# Patient Record
Sex: Female | Born: 1955 | Race: Black or African American | Hispanic: No | Marital: Married | Smoking: Former smoker
Health system: Southern US, Community
[De-identification: ages and names within clinical notes are randomized; demographics above are authoritative.]

## PROBLEM LIST (undated history)

## (undated) DIAGNOSIS — E785 Hyperlipidemia, unspecified: Secondary | ICD-10-CM

## (undated) DIAGNOSIS — I10 Essential (primary) hypertension: Secondary | ICD-10-CM

## (undated) DIAGNOSIS — D869 Sarcoidosis, unspecified: Secondary | ICD-10-CM

## (undated) DIAGNOSIS — N879 Dysplasia of cervix uteri, unspecified: Secondary | ICD-10-CM

## (undated) DIAGNOSIS — E669 Obesity, unspecified: Secondary | ICD-10-CM

## (undated) HISTORY — DX: Hyperlipidemia, unspecified: E78.5

## (undated) HISTORY — DX: Sarcoidosis, unspecified: D86.9

## (undated) HISTORY — PX: UMBILICAL HERNIA REPAIR: SHX196

## (undated) HISTORY — DX: Obesity, unspecified: E66.9

## (undated) HISTORY — DX: Dysplasia of cervix uteri, unspecified: N87.9

## (undated) HISTORY — DX: Essential (primary) hypertension: I10

## (undated) HISTORY — PX: APPENDECTOMY: SHX54

---

## 1998-12-24 ENCOUNTER — Other Ambulatory Visit: Admission: RE | Admit: 1998-12-24 | Discharge: 1998-12-24 | Payer: Self-pay | Admitting: Obstetrics & Gynecology

## 1999-01-27 ENCOUNTER — Other Ambulatory Visit: Admission: RE | Admit: 1999-01-27 | Discharge: 1999-01-27 | Payer: Self-pay | Admitting: Obstetrics & Gynecology

## 1999-01-27 ENCOUNTER — Encounter (INDEPENDENT_AMBULATORY_CARE_PROVIDER_SITE_OTHER): Payer: Self-pay | Admitting: Specialist

## 1999-03-10 ENCOUNTER — Other Ambulatory Visit: Admission: RE | Admit: 1999-03-10 | Discharge: 1999-03-10 | Payer: Self-pay | Admitting: Obstetrics & Gynecology

## 1999-05-04 ENCOUNTER — Other Ambulatory Visit: Admission: RE | Admit: 1999-05-04 | Discharge: 1999-05-04 | Payer: Self-pay | Admitting: Obstetrics & Gynecology

## 1999-05-04 ENCOUNTER — Encounter (INDEPENDENT_AMBULATORY_CARE_PROVIDER_SITE_OTHER): Payer: Self-pay | Admitting: Specialist

## 1999-05-04 HISTORY — PX: LEEP: SHX91

## 1999-08-24 ENCOUNTER — Other Ambulatory Visit: Admission: RE | Admit: 1999-08-24 | Discharge: 1999-08-24 | Payer: Self-pay | Admitting: Obstetrics & Gynecology

## 2000-03-02 ENCOUNTER — Other Ambulatory Visit: Admission: RE | Admit: 2000-03-02 | Discharge: 2000-03-02 | Payer: Self-pay | Admitting: Obstetrics & Gynecology

## 2003-11-26 ENCOUNTER — Other Ambulatory Visit: Admission: RE | Admit: 2003-11-26 | Discharge: 2003-11-26 | Payer: Self-pay | Admitting: Family Medicine

## 2004-11-30 ENCOUNTER — Other Ambulatory Visit: Admission: RE | Admit: 2004-11-30 | Discharge: 2004-11-30 | Payer: Self-pay | Admitting: Family Medicine

## 2004-12-15 ENCOUNTER — Encounter: Admission: RE | Admit: 2004-12-15 | Discharge: 2004-12-15 | Payer: Self-pay | Admitting: Family Medicine

## 2006-02-28 ENCOUNTER — Other Ambulatory Visit: Admission: RE | Admit: 2006-02-28 | Discharge: 2006-02-28 | Payer: Self-pay | Admitting: Family Medicine

## 2006-03-28 ENCOUNTER — Ambulatory Visit (HOSPITAL_COMMUNITY): Admission: RE | Admit: 2006-03-28 | Discharge: 2006-03-28 | Payer: Self-pay | Admitting: Surgery

## 2006-08-21 ENCOUNTER — Inpatient Hospital Stay (HOSPITAL_COMMUNITY): Admission: EM | Admit: 2006-08-21 | Discharge: 2006-08-22 | Payer: Self-pay | Admitting: Emergency Medicine

## 2006-08-21 ENCOUNTER — Encounter (INDEPENDENT_AMBULATORY_CARE_PROVIDER_SITE_OTHER): Payer: Self-pay | Admitting: Specialist

## 2007-07-04 ENCOUNTER — Other Ambulatory Visit: Admission: RE | Admit: 2007-07-04 | Discharge: 2007-07-04 | Payer: Self-pay | Admitting: Family Medicine

## 2007-08-01 ENCOUNTER — Encounter: Admission: RE | Admit: 2007-08-01 | Discharge: 2007-08-01 | Payer: Self-pay | Admitting: Family Medicine

## 2008-08-03 ENCOUNTER — Other Ambulatory Visit: Admission: RE | Admit: 2008-08-03 | Discharge: 2008-08-03 | Payer: Self-pay | Admitting: Family Medicine

## 2008-08-12 ENCOUNTER — Encounter: Admission: RE | Admit: 2008-08-12 | Discharge: 2008-08-12 | Payer: Self-pay | Admitting: Family Medicine

## 2008-08-17 ENCOUNTER — Other Ambulatory Visit: Admission: RE | Admit: 2008-08-17 | Discharge: 2008-08-17 | Payer: Self-pay | Admitting: Family Medicine

## 2008-08-21 ENCOUNTER — Encounter: Admission: RE | Admit: 2008-08-21 | Discharge: 2008-08-21 | Payer: Self-pay | Admitting: Family Medicine

## 2009-09-17 ENCOUNTER — Other Ambulatory Visit: Admission: RE | Admit: 2009-09-17 | Discharge: 2009-09-17 | Payer: Self-pay | Admitting: Family Medicine

## 2010-02-24 ENCOUNTER — Encounter: Admission: RE | Admit: 2010-02-24 | Discharge: 2010-02-24 | Payer: Self-pay | Admitting: Family Medicine

## 2010-03-04 ENCOUNTER — Encounter: Admission: RE | Admit: 2010-03-04 | Discharge: 2010-03-04 | Payer: Self-pay | Admitting: Family Medicine

## 2010-03-30 ENCOUNTER — Encounter: Admission: RE | Admit: 2010-03-30 | Discharge: 2010-03-30 | Payer: Self-pay | Admitting: Family Medicine

## 2010-04-06 DIAGNOSIS — E785 Hyperlipidemia, unspecified: Secondary | ICD-10-CM | POA: Insufficient documentation

## 2010-04-06 DIAGNOSIS — E669 Obesity, unspecified: Secondary | ICD-10-CM | POA: Insufficient documentation

## 2010-04-06 DIAGNOSIS — I10 Essential (primary) hypertension: Secondary | ICD-10-CM | POA: Insufficient documentation

## 2010-04-06 DIAGNOSIS — N879 Dysplasia of cervix uteri, unspecified: Secondary | ICD-10-CM | POA: Insufficient documentation

## 2010-04-07 ENCOUNTER — Ambulatory Visit: Payer: Self-pay | Admitting: Emergency Medicine

## 2010-04-07 DIAGNOSIS — R05 Cough: Secondary | ICD-10-CM

## 2010-04-07 DIAGNOSIS — R059 Cough, unspecified: Secondary | ICD-10-CM | POA: Insufficient documentation

## 2010-04-07 DIAGNOSIS — R930 Abnormal findings on diagnostic imaging of skull and head, not elsewhere classified: Secondary | ICD-10-CM | POA: Insufficient documentation

## 2010-05-17 ENCOUNTER — Ambulatory Visit: Payer: Self-pay | Admitting: Internal Medicine

## 2010-05-19 ENCOUNTER — Ambulatory Visit: Payer: Self-pay | Admitting: Emergency Medicine

## 2010-05-20 ENCOUNTER — Telehealth (INDEPENDENT_AMBULATORY_CARE_PROVIDER_SITE_OTHER): Payer: Self-pay | Admitting: *Deleted

## 2010-05-26 ENCOUNTER — Ambulatory Visit
Admission: RE | Admit: 2010-05-26 | Discharge: 2010-05-26 | Payer: Self-pay | Source: Home / Self Care | Attending: Emergency Medicine | Admitting: Emergency Medicine

## 2010-06-01 ENCOUNTER — Telehealth (INDEPENDENT_AMBULATORY_CARE_PROVIDER_SITE_OTHER): Payer: Self-pay | Admitting: *Deleted

## 2010-06-02 ENCOUNTER — Ambulatory Visit
Admission: RE | Admit: 2010-06-02 | Discharge: 2010-06-02 | Payer: Self-pay | Source: Home / Self Care | Attending: Emergency Medicine | Admitting: Emergency Medicine

## 2010-06-02 ENCOUNTER — Encounter: Payer: Self-pay | Admitting: Emergency Medicine

## 2010-06-03 ENCOUNTER — Encounter: Payer: Self-pay | Admitting: Emergency Medicine

## 2010-06-10 ENCOUNTER — Telehealth (INDEPENDENT_AMBULATORY_CARE_PROVIDER_SITE_OTHER): Payer: Self-pay | Admitting: *Deleted

## 2010-06-17 ENCOUNTER — Telehealth (INDEPENDENT_AMBULATORY_CARE_PROVIDER_SITE_OTHER): Payer: Self-pay | Admitting: *Deleted

## 2010-07-05 NOTE — Assessment & Plan Note (Signed)
Summary: abnormal CT, cough   Visit Type:  Initial Consult Copy to:  Dr. Shaune Pollack Primary Provider/Referring Provider:  Dr. Shaune Pollack  CC:  Pulmonary consult....  History of Present Illness: 55 yo former smoker, has been well until May 2011 when she developed a dry cough, globus sensation, treated for possible sinusitis. Helped initially but then immediately returned. She has been treated with abx x 3. CXR done in Sept with RUL infiltrate. The cough continued and she had CT scan chest in 9/11 that confirmed a nodular infiltrate in a bronchovasc pattern. The cough has improved over the last 2 weeks. 03/30/10 had a repeat CXR that showed the RUL infiltrate had not resolved. PPD with Dr Kevan Ny negative. ? 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 Cough is better but not gone.   Preventive Screening-Counseling & Management  Alcohol-Tobacco     Smoking Status: quit     Smoking Cessation Counseling: yes     Smoke Cessation Stage: quit     Packs/Day: 0.75     Year Started: 1980     Year Quit: 2000     Tobacco Counseling: to remain off tobacco products  Current Medications (verified): 1)  Calcium 600 Mg Tabs (Calcium) .Marland Kitchen.. 1 By Mouth Two Times A Day 2)  Vitamin D (Ergocalciferol) 50000 Unit Caps (Ergocalciferol) .Marland Kitchen.. 1 By Mouth Weekly 3)  Lovastatin 40 Mg Tabs (Lovastatin) .Marland Kitchen.. 1 By Mouth Every Evening 4)  Hydrochlorothiazide 25 Mg Tabs (Hydrochlorothiazide) .Marland Kitchen.. 1 By Mouth Daily 5)  Klor-Con M20 20 Meq Cr-Tabs (Potassium Chloride Crys Cr) .Marland Kitchen.. 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  Smoking Status:  quit Packs/Day:  0.75  Vital Signs:  Patient profile:   55 year old female Height:      60 inches (152.40 cm) Weight:      180 pounds (81.82 kg) BMI:     35.28 O2 Sat:      98 % on Room air Temp:     98.3 degrees F (36.83 degrees C) oral Pulse rate:   95 / minute BP sitting:   124 / 86  (left arm) Cuff size:   regular  Vitals Entered By: Michel Bickers CMA (April 07, 2010 4:33 PM)  O2 Sat at Rest %:  98 O2 Flow:  Room air CC: Pulmonary consult... Is Patient Diabetic? No Comments Medications reviewed with patient. Michel Bickers [facility]  April 07, 2010 4:34 PM   Physical Exam  General:  normal appearance, overwt and healthy appearing.   Head:  normocephalic and atraumatic Eyes:  conjunctiva and sclera clear Nose:  no deformity, discharge, inflammation, or lesions Lungs:  clear B, no crackles or wheezes Heart:  regular rate and rhythm, S1, S2 without murmurs, rubs, gallops, or clicks Abdomen:  not examined Neurologic:  non-focal Skin:  intact without lesions or rashes Psych:  alert and cooperative; normal mood and affect; normal attention span and concentration   Impression & Recommendations:  Problem # 1:  COUGH (ICD-786.2) Etiology unclear, but improving. Suspect this was related to the radiographical abnormality, ?  CAP vs atypical PNA.   Problem # 2:  COMPUTERIZED TOMOGRAPHY, CHEST, ABNORMAL (ICD-793.1) Very interesting scan, RUL nodular infiltrate in bronchovascular patern. Expect infectious or inflammatory. BAC a possibility but appearance is atypical.  - will repeat the Ct in Dec - if the infiltrates persist then FOB with BAL and possible bx's to determine etiology.   Other Orders: Consultation Level IV [phone]) Radiology Referral (Radiology)  Patient Instructions: 1)  We will repeat your CT scan of the chest at the end of December to compare  with your prior.  2)  Depending on the results and how you are feeling, we may decide to do a bronchoscopy to evaluate your right lung. 3)  Follow with Dr Delton Coombes after the scan, and call our office if your cough or breatnhing worsen in any way.

## 2010-07-07 NOTE — Progress Notes (Signed)
Summary: FOB sch for 12/22 @ 2pm  Phone Note Call from Patient   Caller: Patient Call For: byrum Summary of Call: pt wants to know if the FOB at [facility] has been set up for next week yet. she has been waiting to hear from someone. ok to leave detailed msg on phone per pt. [phone]      Initial call taken by: Tivis Ringer, CNA,  May 20, 2010 1:27 PM  Follow-up for Phone Call        Will forward to RB, pls advise thanks Vernie Murders  May 20, 2010 2:30 PM  They couldnt schedule for me today. We will need to call Resp next week to set it up on either Thurs or Fri afternoon. Please call pt to let her know, then call resp office next week to get it set up. thanks Leslye Peer MD  May 20, 2010 5:12 PM   Follow-up by: Leslye Peer MD,  May 20, 2010 5:12 PM  Additional Follow-up for Phone Call Additional follow up Details #1::        Spoke with pt and notified that we are aiming for next thurs or fri afternoon, but could not sched this today but will call resp office and set it up.  Will forward to [place] to sched. Vernie Murders  May 20, 2010 5:17 PM     Additional Follow-up for Phone Call Additional follow up Details #2::    FOB sch at [facility] for Thurs., 05/26/2010 @ 2pm.  LMOM for pt to return my call for instructions.Michel Bickers [facility]  May 23, 2010 12:13 PM  Pt is aware of procedure date and time she and when she needs to arrive at [facility].  NPO after midnight and someone with her to drive. She had verbal understanding of all instructions. RB aware of date and time for FOB also. Follow-up by: Michel Bickers CMA,  May 23, 2010 12:24 PM

## 2010-07-07 NOTE — Progress Notes (Signed)
Summary: results  Phone Note Call from Patient   Caller: Patient Call For: byrum Summary of Call: pt waiting to hear back re: lab results. [phone] Initial call taken by: Tivis Ringer, CNA,  June 10, 2010 9:50 AM  Follow-up for Phone Call        PT informed that lab results are not back yet.  Checked with [facility] and they said it would be another 5-7 days.  Instructed pt to call by the end of next week if she had not heard from our office. Abigail Miyamoto RN  June 10, 2010 10:48 AM

## 2010-07-07 NOTE — Progress Notes (Signed)
Summary: bx results---hypersens panel ordered  Phone Note Outgoing Call Call back at [phone]   Call placed by: Leslye Peer MD,  June 01, 2010 4:58 PM Call placed to: Patient Summary of Call: Discussed bx results w patient. Consistent with either HSP or sarcoidosis. need to order serum precipitans.  Initial call taken by: Leslye Peer MD,  June 01, 2010 5:01 PM  Follow-up for Phone Call        Patient will come for labs on 06/02/2010 @ 4pm. Follow-up by: Michel Bickers CMA,  June 01, 2010 5:26 PM

## 2010-07-07 NOTE — Assessment & Plan Note (Signed)
Summary: cough, abnormal CT scan   Visit Type:  Follow-up Copy to:  Dr. Shaune Pollack Primary Provider/Referring Provider:  Dr. Shaune Pollack  CC:  Follow up on CT. c/o cough intermittent productive with clear mucus same is worse when at work and better when in heated areas and such as "the shower". No new complaints.  History of Present Illness: 55 yo former smoker, has been well until May 2011 when she developed a dry cough, globus sensation, treated for possible sinusitis. Helped initially but then immediately returned. She has been treated with abx x 3. CXR done in Sept with RUL infiltrate. The cough continued and she had CT scan chest in 9/11 that confirmed a nodular infiltrate in a bronchovasc pattern. The cough has improved over the last 2 weeks. 03/30/10 had a repeat CXR that showed the RUL infiltrate had not resolved. PPD with Dr Kevan Ny negative. ? 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 Cough is better but not gone.   ROV 05/19/10 -- Returns for eval of her cough and abnormal CT scan. She continues to have cough, seems to wax and wane. CT scan shows no change in B UL nodular infiltrates. Of note, she tells me that her husband owns birds. She doesn't have large exposure, but there have been birds in the home since 2001!! I apparently didn't ask about birds last time.   Preventive Screening-Counseling & Management  Alcohol-Tobacco     Smoking Status: quit     Smoking Cessation Counseling: yes     Smoke Cessation Stage: quit     Packs/Day: 0.75     Year Started: 1980     Year Quit: 2000     Tobacco Counseling: to remain off tobacco products  Current Medications (verified): 1)  Calcium 600 Mg Tabs (Calcium) .Marland Kitchen.. 1 By Mouth Two Times A Day 2)  Vitamin D (Ergocalciferol) 50000 Unit Caps (Ergocalciferol) .Marland Kitchen.. 1 By Mouth Weekly 3)  Lovastatin 40 Mg Tabs (Lovastatin) .Marland Kitchen.. 1 By Mouth Every Evening 4)  Hydrochlorothiazide 25 Mg Tabs (Hydrochlorothiazide) .Marland Kitchen.. 1 By Mouth  Daily 5)  Klor-Con M20 20 Meq Cr-Tabs (Potassium Chloride Crys Cr) .Marland Kitchen.. 1 By Mouth Daily  Allergies (verified): No Known Drug Allergies  Social History: Patient states former smoker.  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 c/o cough intermittent productive with clear mucus same is worse when at work and better when in heated areas, such as "the shower". No new complaints Comments Medications reviewed with patient Verified contact number and pharmacy with patient Zackery Barefoot CMA  May 19, 2010 4:19 PM     Physical Exam  General:  normal appearance, overwt and healthy appearing.   Head:  normocephalic and atraumatic Eyes:  conjunctiva and sclera clear Nose:  no deformity, discharge, inflammation, or lesions Lungs:  clear B, no crackles or wheezes  Heart:  regular rate and rhythm, S1, S2 without murmurs, rubs, gallops, or clicks Abdomen:  not examined Neurologic:  non-focal Skin:  intact without lesions or rashes Psych:  alert and cooperative; normal mood and affect; normal attention span and concentration   Impression & Recommendations:  Problem # 1:  COMPUTERIZED TOMOGRAPHY, CHEST, ABNORMAL (ICD-793.1)  The pattern could certainly be consistent with HSP, and I didn't ask her about the birds last time. Her CT scan is unchanged.  - set up fob w bx' s and washes.   Orders: Est. Patient Level IV (04540) Pulmonary Referral (Pulmonary)  Problem # 2:  COUGH (ICD-786.2)  - as needed tussionex  Orders: Est. Patient Level IV (98119) Pulmonary  Referral (Pulmonary)  Medications Added to Medication List This Visit: 1)  Tussionex Pennkinetic Er 10-8 Mg/30ml Lqcr (Hydrocod polst-chlorphen polst) .... 5cc by mouth q12h as needed cough  Patient Instructions: 1)  We will set a bronchoscopy to better evaluate your cough and the changes on your CT scan 2)  Take tussionex as directed for cough.  Prescriptions: TUSSIONEX PENNKINETIC ER 10-8 MG/5ML LQCR (HYDROCOD POLST-CHLORPHEN POLST) 5cc by mouth q12h as needed cough  #4 oz x 1   Entered and Authorized by:   Leslye Peer MD   Signed by:   Leslye Peer MD on 05/19/2010   Method used:   Print then Give to Patient   RxID:   [id number]    Not Administered:    Influenza Vaccine not given due to: declined

## 2010-07-07 NOTE — Progress Notes (Signed)
Summary: lab results-LMTCBx1  Phone Note Call from Patient   Caller: Patient Call For: DR [facility] Summary of Call: Patient phoned regarding her lab results she would like for someone to call her with the results. Patient can be reached [phone] Initial call taken by: Vedia Coffer,  June 17, 2010 5:05 PM  Follow-up for Phone Call        Lawson Fiscal, have you seen these results yet? Pls advise thanks Vernie Murders  June 17, 2010 5:13 PM  Lab is back and looks like all results are negative. Pt is calling for results and would like to know what the next step will be. Pls advise.Michel Bickers CMA  June 20, 2010 1:17 PM  Given the negative serum precipitans, suspect that this is sarcoidosis. Probably need to treat w streroids. Let her know the results and tell her we will get her started on therapy when I get to office 1/17 Leslye Peer MD  June 20, 2010 8:59 PM   Follow-up by: Leslye Peer MD,  June 20, 2010 8:59 PM  Additional Follow-up for Phone Call Additional follow up Details #1::        LMTCBx1. Carron Curie CMA  June 21, 2010 9:33 AM  Want to start prednisone 40mg  by mouth once daily for the next month and then taper. please order per her pharmacy and arrange for f/u w me in a month. I don't think exposure to mice has anything to do with her findings. Leslye Peer MD  June 28, 2010 10:20 AM   Additional Follow-up by: Leslye Peer MD,  June 28, 2010 10:20 AM    Additional Follow-up for Phone Call Additional follow up Details #2::    called and spoke with pt.  informed her lab results negative.  will forward this message back to RB regarding starting pt steroid therapy.  also, pt wanted to know if mice urine could have caused her to have this cough.  please advise.  thanks.  Aundra Millet Reynolds LPN  June 21, 2010 4:02 PM    Pt notified of Dr Delton Coombes' s directions regarding Prednisone .  Pt to f/u with Dr Delton Coombes is one month to adjust Pred dose. Abigail Miyamoto RN  June 28, 2010 10:27 AM   New/Updated Medications: PREDNISONE 20 MG TABS (PREDNISONE) Take 2 tabs once daily Prescriptions: PREDNISONE 20 MG TABS (PREDNISONE) Take 2 tabs once daily  #60 x 3   Entered by:   Abigail Miyamoto RN   Authorized by:   Leslye Peer MD   Signed by:   Abigail Miyamoto RN on 06/28/2010   Method used:   Electronically to        [facility]  [address] [phone]* (retail)       [address]       [place], [place]  [id number]       Ph: [phone] or [phone]       Fax: [phone]   RxID:   [id number]

## 2010-07-18 ENCOUNTER — Telehealth: Payer: Self-pay | Admitting: Pulmonary Disease

## 2010-07-20 ENCOUNTER — Ambulatory Visit
Admission: RE | Admit: 2010-07-20 | Discharge: 2010-07-20 | Disposition: A | Discharge status: Home / Self Care | Payer: BC Managed Care – PPO | Source: Ambulatory Visit | Attending: Family Medicine | Admitting: Family Medicine

## 2010-07-20 ENCOUNTER — Other Ambulatory Visit: Payer: Self-pay | Admitting: Family Medicine

## 2010-07-20 DIAGNOSIS — Z862 Personal history of diseases of the blood and blood-forming organs and certain disorders involving the immune mechanism: Secondary | ICD-10-CM

## 2010-07-27 NOTE — Progress Notes (Signed)
Summary: swollen face / meds-  Phone Note Call from Patient   Caller: Patient Call For: byrum Summary of Call: prednisone is causing pt's face to "puff up". [phone] Initial call taken by: Tivis Ringer, CNA,  July 18, 2010 9:34 AM  Follow-up for Phone Call        ATC pt twice at work number. Extention just rings, there is no answer. I called and spoke to operator and she states she cannot page pt. I LMTCB at home number. I WCB. Carron Curie CMA  July 18, 2010 12:28 PM  Pt c/o significant puffiness of her face, dizziness at times, says she doesn't feel well in general. Increased urination as well. Pt is currently on Prenisone 40mg  daily. RB out of the office this afternoon and will forward msg to doc of the afternoon, Dr. Craige Cotta, for any recs.  Follow-up by: Michel Bickers CMA,  July 18, 2010 2:31 PM  Additional Follow-up for Phone Call Additional follow up Details #1::        She has been on prednisone 40 mg once daily since Jan 25.  She has noticed facial swelling since being on prednisone.  I have explained that this is an expected side effect.  She has also been getting increased appetite, fatigue, and increase urinary frequency with nocturia.  She has been also feeling dizzy and thirsty.  She denies prior history of diabetes mellitus.  I explained that she could have a steroid induced diabetes, and that this will need to be further evaluated.  Offered to arrange for evaluation of this in our office.  She states that her copay is less expensive if she sees her primary physician first.  She will therefore call Dr. Kevan Ny and schedule appointment.  Advised her to call if she needs further assistance.  Will forward to Dr. Delton Coombes for his review. Additional Follow-up by: Coralyn Helling MD,  July 18, 2010 2:41 PM     Appended Document: swollen face / meds- Thanks you RSB

## 2010-08-01 ENCOUNTER — Ambulatory Visit (INDEPENDENT_AMBULATORY_CARE_PROVIDER_SITE_OTHER): Payer: BC Managed Care – PPO | Admitting: Emergency Medicine

## 2010-08-01 ENCOUNTER — Encounter: Payer: Self-pay | Admitting: Emergency Medicine

## 2010-08-01 DIAGNOSIS — D869 Sarcoidosis, unspecified: Secondary | ICD-10-CM | POA: Insufficient documentation

## 2010-08-01 DIAGNOSIS — D860 Sarcoidosis of lung: Secondary | ICD-10-CM | POA: Insufficient documentation

## 2010-08-11 NOTE — Assessment & Plan Note (Signed)
Summary: sarcoidosis   Visit Type:  Follow-up Copy to:  Dr. Shaune Pollack Primary Provider/Referring Provider:  Dr. Shaune Pollack  CC:  Bronchoscopy follow-up.  Cough is better per patient.  On Prednisone taper at 20mg  daily.  Pt's glucose level has been elevated on Prednisone.Marland Kitchen  History of Present Illness: 55 yo former smoker, has been well until May 2011 when she developed a dry cough, globus sensation, treated for possible sinusitis. Helped initially but then immediately returned. She has been treated with abx x 3. CXR done in Sept with RUL infiltrate. The cough continued and she had CT scan chest in 9/11 that confirmed a nodular infiltrate in a bronchovasc pattern. The cough has improved over the last 2 weeks. 03/30/10 had a repeat CXR that showed the RUL infiltrate had not resolved. PPD with Dr Kevan Ny negative. ? 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 Cough is better but not gone.   ROV 05/19/10 -- Returns for eval of her cough and abnormal CT scan. She continues to have cough, seems to wax and wane. CT scan shows no change in B UL nodular infiltrates. Of note, she tells me that her husband owns birds. She doesn't have large exposure, but there have been birds in the home since 2001!! I apparently didn't ask about birds last time.   ROV 08/01/10 -- Abnormal CT scan, presumed sarcoidosis (HSP panel negative). She tells me that she feels better since our prednisone. Cough is also better. She was seen by Dr Kevan Ny because she was showing signs of DM on the prednisone. We decreased pred to 30mg  and then 20mg  after that happened. She will taper to 10mg  and then to off.   Preventive Screening-Counseling & Management  Alcohol-Tobacco     Smoking Status: quit     Smoke Cessation Stage: quit     Packs/Day: 0.75     Year Started: 1980     Year Quit: 2000     Tobacco Counseling: to remain off tobacco products  Current Medications (verified): 1)  Calcium 600 Mg Tabs (Calcium) .Marland Kitchen.. 1  By Mouth Two Times A Day 2)  Vitamin D (Ergocalciferol) 50000 Unit Caps (Ergocalciferol) .Marland Kitchen.. 1 By Mouth Weekly 3)  Lovastatin 40 Mg Tabs (Lovastatin) .Marland Kitchen.. 1 By Mouth Every Evening 4)  Hydrochlorothiazide 25 Mg Tabs (Hydrochlorothiazide) .Marland Kitchen.. 1 By Mouth Daily 5)  Klor-Con M20 20 Meq Cr-Tabs (Potassium Chloride Crys Cr) .Marland Kitchen.. 1 By Mouth Daily 6)  Prednisone 20 Mg Tabs (Prednisone) .... Take 2 Tabs Once Daily  Allergies (verified): No Known Drug Allergies  Vital Signs:  Patient profile:   55 year old female Height:      60 inches (152.40 cm) Weight:      177.13 pounds (80.51 kg) BMI:     34.72 O2 Sat:      98 % on Room air Temp:     98.0 degrees F (36.67 degrees C) oral Pulse rate:   82 / minute BP sitting:   140 / 80  (right arm) Cuff size:   regular  Vitals Entered By: Michel Bickers CMA (August 01, 2010 4:48 PM)  O2 Sat at Rest %:  98 O2 Flow:  Room air CC: Bronchoscopy follow-up.  Cough is better per patient.  On Prednisone taper at 20mg  daily.  Pt's glucose level has been elevated on Prednisone. Comments Medications reviewed with patient Michel Bickers [facility]  August 01, 2010 4:49 PM   Physical Exam  General:  normal appearance, overwt and  healthy appearing.   Head:  normocephalic and atraumatic Eyes:  conjunctiva and sclera clear Nose:  no deformity, discharge, inflammation, or lesions Lungs:  clear B, no crackles or wheezes Heart:  regular rate and rhythm, S1, S2 without murmurs, rubs, gallops, or clicks Abdomen:  not examined Neurologic:  non-focal Skin:  intact without lesions or rashes Psych:  alert and cooperative; normal mood and affect; normal attention span and concentration   Impression & Recommendations:  Problem # 1:  SARCOIDOSIS (ICD-135) Clinically improved on the pred, CXR improved from 07/20/10.  - taper the pred to off - f/u w Dr Kevan Ny regarding steroid-induced hyperglycemia - CXR in a month, ROV to review  Other Orders: Est. Patient Level IV  (69629)  Patient Instructions: 1)  We will continue to taper your prednisone to off as already ordered by Dr Kevan Ny.  2)  Follow up with Dr Delton Coombes in 1 month with a CXR.

## 2010-08-15 LAB — AFB CULTURE WITH SMEAR (NOT AT ARMC): Acid Fast Smear: NONE SEEN

## 2010-08-15 LAB — FUNGUS CULTURE W SMEAR

## 2010-08-15 LAB — CULTURE, RESPIRATORY W GRAM STAIN

## 2010-09-27 ENCOUNTER — Encounter: Payer: Self-pay | Admitting: Emergency Medicine

## 2010-09-28 ENCOUNTER — Ambulatory Visit (INDEPENDENT_AMBULATORY_CARE_PROVIDER_SITE_OTHER): Payer: BC Managed Care – PPO | Admitting: Emergency Medicine

## 2010-09-28 ENCOUNTER — Encounter: Payer: Self-pay | Admitting: Emergency Medicine

## 2010-09-28 ENCOUNTER — Ambulatory Visit (INDEPENDENT_AMBULATORY_CARE_PROVIDER_SITE_OTHER)
Admission: RE | Admit: 2010-09-28 | Discharge: 2010-09-28 | Disposition: A | Discharge status: Home / Self Care | Payer: BC Managed Care – PPO | Source: Ambulatory Visit | Attending: Emergency Medicine | Admitting: Emergency Medicine

## 2010-09-28 VITALS — BP 112/82 | HR 88 | Temp 98.2°F | Ht 60.0 in | Wt 173.2 lb

## 2010-09-28 DIAGNOSIS — D869 Sarcoidosis, unspecified: Secondary | ICD-10-CM

## 2010-09-28 DIAGNOSIS — R05 Cough: Secondary | ICD-10-CM

## 2010-09-28 DIAGNOSIS — R059 Cough, unspecified: Secondary | ICD-10-CM

## 2010-09-28 MED ORDER — MOMETASONE FUROATE 50 MCG/ACT NA SUSP: 2.0000 | Freq: Every day | NASAL | Status: DC

## 2010-09-28 MED ORDER — LORATADINE 10 MG PO TABS: 10.0000 mg | ORAL_TABLET | Freq: Every day | ORAL | Status: DC

## 2010-09-28 NOTE — Assessment & Plan Note (Signed)
CXR stable. She was unable to complete full course prednisone because of hyperglycemia and hypertension. At this point favor holding off on therapy, follow clinically and CXR

## 2010-09-28 NOTE — Assessment & Plan Note (Signed)
?   Whether this is related to allergies or alternatively nasal/sinus involvement of sarcoidosis. Will treat allergies now, but consider ENT eval or other therapy if we believe this is her sarcoid - loratadine + nasal steroid

## 2010-09-28 NOTE — Progress Notes (Signed)
  Subjective:    Patient ID: [name], female    DOB: 1955-06-20, 55 y.o.   MRN: [id number]  HPI 55 yo former smoker, has been well until May 2011 when she developed a dry cough, globus sensation, treated for possible sinusitis. Helped initially but then immediately returned. She has been treated with abx x 3. CXR done in Sept with RUL infiltrate. The cough continued and she had CT scan chest in 9/11 that confirmed a nodular infiltrate in a bronchovasc pattern. The cough has improved over the last 2 weeks. 03/30/10 had a repeat CXR that showed the RUL infiltrate had not resolved. PPD with Dr Kevan Ny negative. ? 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 Cough is better but not gone.   ROV 05/19/10 -- Returns for eval of her cough and abnormal CT scan. She continues to have cough, seems to wax and wane. CT scan shows no change in B UL nodular infiltrates. Of note, she tells me that her husband owns birds. She doesn't have large exposure, but there have been birds in the home since 2001!! I apparently didn't ask about birds last time.   ROV 08/01/10 -- Abnormal CT scan, presumed sarcoidosis (HSP panel negative). She tells me that she feels better since our prednisone. Cough is also better. She was seen by Dr Kevan Ny because she was showing signs of DM on the prednisone. We decreased pred to 30mg  and then 20mg  after that happened. She will taper to 10mg  and then to off.   ROV 09/28/10 -- Hx Sarcoidosis with pulm manifestations. Tapered off prednisone when she had trouble with hyperglycemia, last was in March. Had done fairly well for weeks after the prednisone, but now beginning to have some PND, clear drainage, hoarse voice, cough. Some paranasal HA. No CP, no wheeze, no dyspnea.    Review of Systems As per HPI    Objective:   Physical Exam Gen: Pleasant, well-nourished, in no distress,  normal affect  ENT: No lesions,  mouth clear,  oropharynx clear, no postnasal drip  Neck: No JVD,  no TMG, no carotid bruits  Lungs: No use of accessory muscles, no dullness to percussion, clear without rales or rhonchi  Cardiovascular: RRR, heart sounds normal, no murmur or gallops, no peripheral edema  Musculoskeletal: No deformities, no cyanosis or clubbing  Neuro: alert, non focal  Skin: Warm, no lesions or rashes    CXR today:  Comparison: July 20, 2010  Findings: The cardiac silhouette, mediastinum, pulmonary vasculature are within normal limits.  There is a nodular interstitial pattern, consistent with history of sarcoidosis, unchanged.  IMPRESSION: No significant change in nodular interstitial pattern, consistent with history of sarcoid disease.       Assessment & Plan:

## 2010-09-28 NOTE — Patient Instructions (Signed)
Please start taking Nasonex 2 sprays each nostril daily Start loratadine 10mg  daily Follow up with Dr Delton Coombes in 1 month If you continue to have problems, we may need to refer you for ENT evaluation to rule out nasal and sinus involvement of your sarcoidosis

## 2010-10-20 ENCOUNTER — Other Ambulatory Visit: Payer: Self-pay | Admitting: Family Medicine

## 2010-10-20 DIAGNOSIS — Z1231 Encounter for screening mammogram for malignant neoplasm of breast: Secondary | ICD-10-CM

## 2010-10-21 ENCOUNTER — Ambulatory Visit
Admission: RE | Admit: 2010-10-21 | Discharge: 2010-10-21 | Disposition: A | Discharge status: Home / Self Care | Payer: BC Managed Care – PPO | Source: Ambulatory Visit | Attending: Family Medicine | Admitting: Family Medicine

## 2010-10-21 DIAGNOSIS — Z1231 Encounter for screening mammogram for malignant neoplasm of breast: Secondary | ICD-10-CM

## 2010-10-21 NOTE — Op Note (Signed)
[name], [name]               ACCOUNT NO.:  [id number]   MEDICAL RECORD NO.:  [id number]          PATIENT TYPE:  INP   LOCATION:  5707                         FACILITY:  [facility]   PHYSICIAN:  Thornton Park. Daphine Deutscher, MD  DATE OF BIRTH:  [birth year]   DATE OF PROCEDURE:  08/20/2006  DATE OF DISCHARGE:                               OPERATIVE REPORT   PREOPERATIVE DIAGNOSIS:  Acute appendicitis.   POSTOPERATIVE DIAGNOSIS:  Acute appendicitis.   PROCEDURE:  Laparoscopic appendectomy.   SURGEON:  Thornton Park. Daphine Deutscher, MD   ANESTHESIA:  General.   DESCRIPTION OF PROCEDURE:  Ms. [name] was taken to room 16.  She has  previously recently had an umbilical hernia repair.  I went ahead and  went below that in the old C-section incision.  I entered the abdomen  without difficulty and insufflated.  I put two other trocars including a  5 in the right upper quadrant and an 11 in the left lower quadrant.  I  grasped the cecum and found the appendix and mobilized it easily.  I  dissected across the base and stapled it with a vascular endo GIA and  went across the mesentery with a harmonic scalpel.  Bleeding was  controlled.  The appendix was placed in a bag and brought out through  the umbilicus.  the umbilical port was repaired with a #0 Vicryl and the  skin was closed with 4-0 Vicryl and the wound was injected with 0.5%  Marcaine.   FINAL DIAGNOSIS:  Acute appendicitis, status post laparoscopic  appendectomy.      Thornton Park Daphine Deutscher, MD  Electronically Signed     MBM/MEDQ  D:  08/21/2006  T:  08/21/2006  Job:  [id number]

## 2010-10-21 NOTE — Discharge Summary (Signed)
[name], [name]               ACCOUNT NO.:  [id number]   MEDICAL RECORD NO.:  [id number]          PATIENT TYPE:  INP   LOCATION:  5707                         FACILITY:  [facility]   PHYSICIAN:  Angelia Mould. Derrell Lolling, M.D.DATE OF BIRTH:  [birth year]   DATE OF ADMISSION:  08/20/2006  DATE OF DISCHARGE:  08/22/2006                               DISCHARGE SUMMARY   CHIEF COMPLAINT AND REASON FOR ADMISSION:  [name] is a 55 year old  female patient of Dr. Shaune Pollack who had a 24-hour history of right  lower quadrant abdominal pain.  She had an umbilical hernia repair by  Dr. Marcille Blanco in October2007 and has done well since that.  The patient  was evaluated in the ER by Dr. Daphine Deutscher.  She was afebrile, but  tachycardiac; vital signs otherwise stable.  Her white count was 12,600,  hemoglobin 13.5, neutrophils 95%, platelet count 246,000, sodium 135,  potassium 3.3, BUN 11, creatinine 0.96.   The patient was admitted with a diagnosis of acute appendicitis.   HOSPITAL COURSE:  The patient was taken directly from the ER to the  operating room by Dr. Daphine Deutscher where she underwent a laparoscopic  appendectomy for acute nonperforated appendicitis.  She was sent to the  general floor to recover; was placed on IV fluids and IV Mefoxin  empirically.   On postop day #1, the patient was feeling well.  She was tolerating a  clear diet.  Bowel sounds were present.  Incisions were clean, dry and  intact.  Plans were to discharge the patient home on March 19.   On postop day #2, the patient was afebrile, vital signs were stable.  She was tolerating a diet.  Was not complaining of any abdominal pain  and was deemed appropriate for discharge home.  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  The patient has FMLA papers which I have  instructed her to take  over to our office to be filled out by Dr.  Ermalene Searing nurse.   FINAL DISCHARGE DIAGNOSES:  1. Acute appendicitis, nonperforated.  2. Status post laparoscopic appendectomy.  3. Hypertension.  4. Dyslipidemia.   DISCHARGE MEDICATIONS:  1. The patient will resume lovastatin 40 mg daily.  2. Hydrochlorothiazide 25 mg daily.  3. Vicodin 5/325 mg one to two tablets every 4 hours as need for pain      #40 dispensed with no refills.  4. Over-the-counter ibuprofen as needed for pain.   DISCHARGE INSTRUCTIONS:  Return to work no sooner than 2 weeks after  surgery dated August 20, 2006.   ACTIVITY:  Increase activity slowly.  May walk up steps.  May shower.  No lifting more than 15 pounds for the next 2 weeks.  No driving while  taking Vicodin.   WOUND CARE:  Allow Steri-Strips to fall off.   DISCHARGE FOLLOWUP:  You need to call Dr. Ermalene Searing office at [phone] to  be seen in  the next 1-2 weeks.   OTHER INSTRUCTIONS:  Call surgeon if:  1. Fever by mouth greater than 101 degrees Fahrenheit.  2. New or increased belly pain.  3. Nausea, vomiting or diarrhea.  4. Redness or drainage from wounds.      Allison L. Rennis Harding, N.P.      Angelia Mould. Derrell Lolling, M.D.  Electronically Signed    ALE/MEDQ  D:  08/22/2006  T:  08/22/2006  Job:  [id number]   cc:   Thornton Park Daphine Deutscher, MD  Wilmon Arms. Tsuei, M.D.  Duncan Dull, M.D.

## 2010-10-21 NOTE — Op Note (Signed)
NAMEJAZIAH, KWASNIK               ACCOUNT NO.:  [id number]   MEDICAL RECORD NO.:  [id number]          PATIENT TYPE:  AMB   LOCATION:  DAY                          FACILITY[facility]  [facility]   PHYSICIAN:  Wilmon Arms. Corliss Skains, M.D. DATE OF BIRTH:  1956/01/31   DATE OF PROCEDURE:  03/28/2006  DATE OF DISCHARGE:                                 OPERATIVE REPORT   PREOPERATIVE DIAGNOSIS:  Epigastric ventral hernia.   POSTOPERATIVE DIAGNOSIS:  Epigastric ventral hernia.   PROCEDURE PERFORMED:  Ventral hernia repair with mesh.   SURGEON:  Wilmon Arms. Tsuei, M.D.   ANESTHESIA:  General endotracheal.   INDICATIONS:  The patient is a 55 year old female who is in good health who  works in a job that involves a lot of heavy lifting.  Over the last year,  she has developed a lump above her umbilicus which has gradually enlarged.  She has occasional discomfort.  She was evaluated, and this was felt to be a  ventral hernia.  We recommended surgical repair.   DESCRIPTION OF PROCEDURE:  The patient was brought to the operating room and  placed in the supine position on the operating room table.  After adequate  level of general anesthesia was obtained, the patient's abdomen was prepped  with Betadine and draped in a sterile fashion.  Time-out was taken to assure  proper patient and proper procedure.  A vertical incision was made directly  over the palpable hernia.  Dissection was carried down into the subcutaneous  tissues.  A fairly large hernia sac was encountered, and we  circumferentially dissected around this hernia sac.  This continued all the  way down to the fascia.  The fascial opening measured approximately 1.5 cm  in diameter.  The hernia sac and the contents were circumferentially  dissected away from the undersurface of the fascia.  The entire hernia sac  was reduced back into the peritoneal space.  The anterior surface of the  fascia was cleared for several centimeters around.  A medium  Ventralex mesh  was inserted into the preperitoneal space and opened.  Stay sutures of 0  Prolene were used to suspend the mesh from the anterior abdominal wall.  A  total of 8 sutures were used.  These sutures were all tied down.  This was  successful in firmly attaching the mesh to the posterior surface of  abdominal wall.  The mesh tapes of the Ventralex mesh were removed.  The  fascia was then closed with several figure-of-eight 0 Prolene sutures.  The  subcutaneous tissues were closed with 3-0 Vicryl.  Four-0 Monocryl was used  to close the skin in subcuticular fashion.  Steri-Strips and clean dressings  were applied.  The patient was then extubated and brought to recovery room  in stable condition.  All sponge, instrument, and needle counts were  correct.      Wilmon Arms. Tsuei, M.D.  Electronically Signed    MKT/MEDQ  D:  03/28/2006  T:  03/29/2006  Job:  [id number]

## 2010-10-25 ENCOUNTER — Encounter: Payer: Self-pay | Admitting: Emergency Medicine

## 2010-11-03 ENCOUNTER — Ambulatory Visit: Payer: BC Managed Care – PPO | Admitting: Emergency Medicine

## 2011-10-18 ENCOUNTER — Other Ambulatory Visit: Payer: Self-pay | Admitting: Family Medicine

## 2011-10-18 DIAGNOSIS — Z1231 Encounter for screening mammogram for malignant neoplasm of breast: Secondary | ICD-10-CM

## 2011-10-23 IMAGING — CT CT CHEST SUPER D W/O CM
1 of 3 series · 4 of 36 positions shown, 5 images · non-contrast
Comparison: Radiographs 03/30/2010 and CT 03/04/2010.

CLINICAL DATA: Former smoker.  Follow-up right upper lobe
infiltrate.  No history of malignancy.

CT CHEST WITHOUT CONTRAST
TECHNIQUE: Multidetector CT imaging of the chest was performed
using thin slice collimation for electromagnetic bronchoscopy
planning purposes, without intravenous contrast.

[Series 602: <mpr thick range> · coronal · 0.63mm/px · 4 of 107 slices shown, 5 images]
[im 22/107  mediastinal]
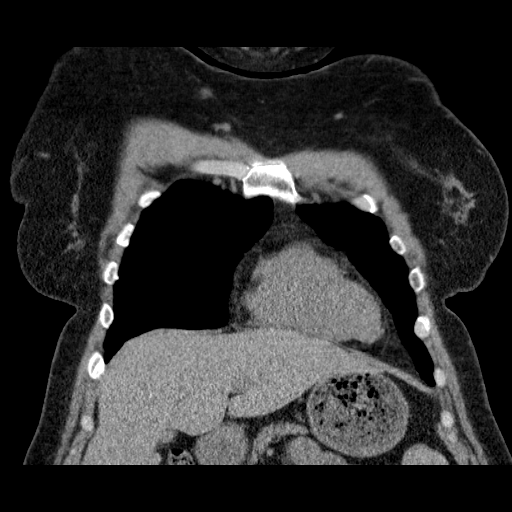
[im 22/107  lung]
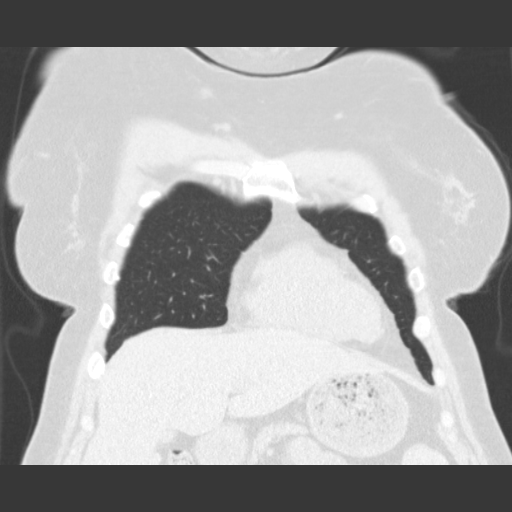
[im 43/107  lung]
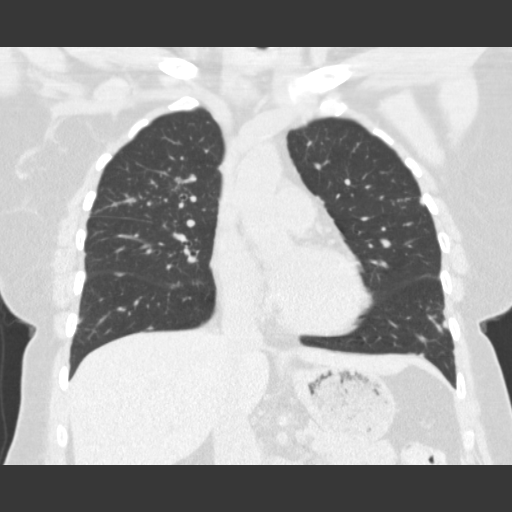
[im 64/107  lung]
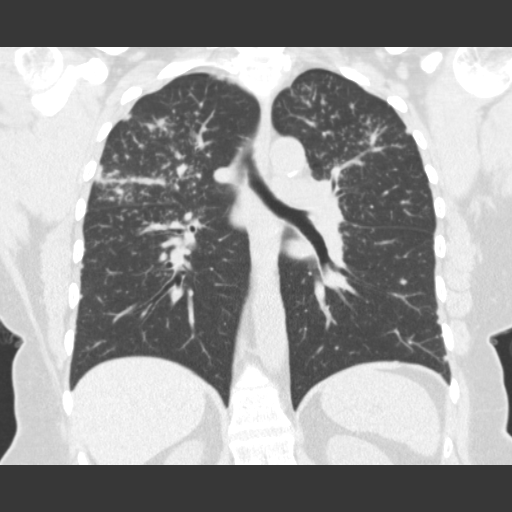
[im 85/107  lung]
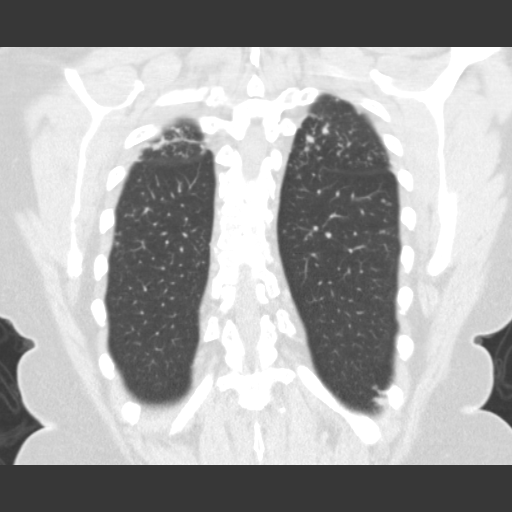

[4 of 36 positions shown; findings below may reference images not displayed]

FINDINGS: Again demonstrated is extensive reticular nodular
interstitial lung disease.  This has an upper lobe predominance and
is most advanced in the right upper lobe.  There is lesser
involvement in the superior segments of the lower lobes
bilaterally.  There is no dominant mass, endobronchial lesion or
confluent airspace opacity.  Compared with the CT of the 10 weeks
ago, little change is identified.

Prominent mediastinal and hilar lymph nodes have not substantially
changed.  There is a subcarinal node measuring 1.4 cm short axis on
image 21.  There is no pleural or pericardial effusion.  The
visualized upper abdomen has a stable appearance.
IMPRESSION: 1.  No significant change in extensive reticular nodular
interstitial lung disease with associated adenopathy compared with
CT in approximately 10 weeks ago.
2.  Primary differential considerations for this process include
sarcoidosis and granulomatous infection (atypical mycobacterial or
fungal infection).  Less likely considerations would include
pneumoconiosis, eosinophilic granuloma and atypical neoplasm.

## 2011-10-24 ENCOUNTER — Ambulatory Visit
Admission: RE | Admit: 2011-10-24 | Discharge: 2011-10-24 | Disposition: A | Discharge status: Home / Self Care | Payer: BC Managed Care – PPO | Source: Ambulatory Visit | Attending: Family Medicine | Admitting: Family Medicine

## 2011-10-24 DIAGNOSIS — Z1231 Encounter for screening mammogram for malignant neoplasm of breast: Secondary | ICD-10-CM

## 2011-12-26 IMAGING — CR DG CHEST 2V
2 series · 2 of 2 positions shown · non-contrast
Comparison: 05/26/2010 and multiple previous

CLINICAL DATA: History of sarcoid.  Follow-up.

CHEST - 2 VIEW

[view not recorded (1 of 2)]
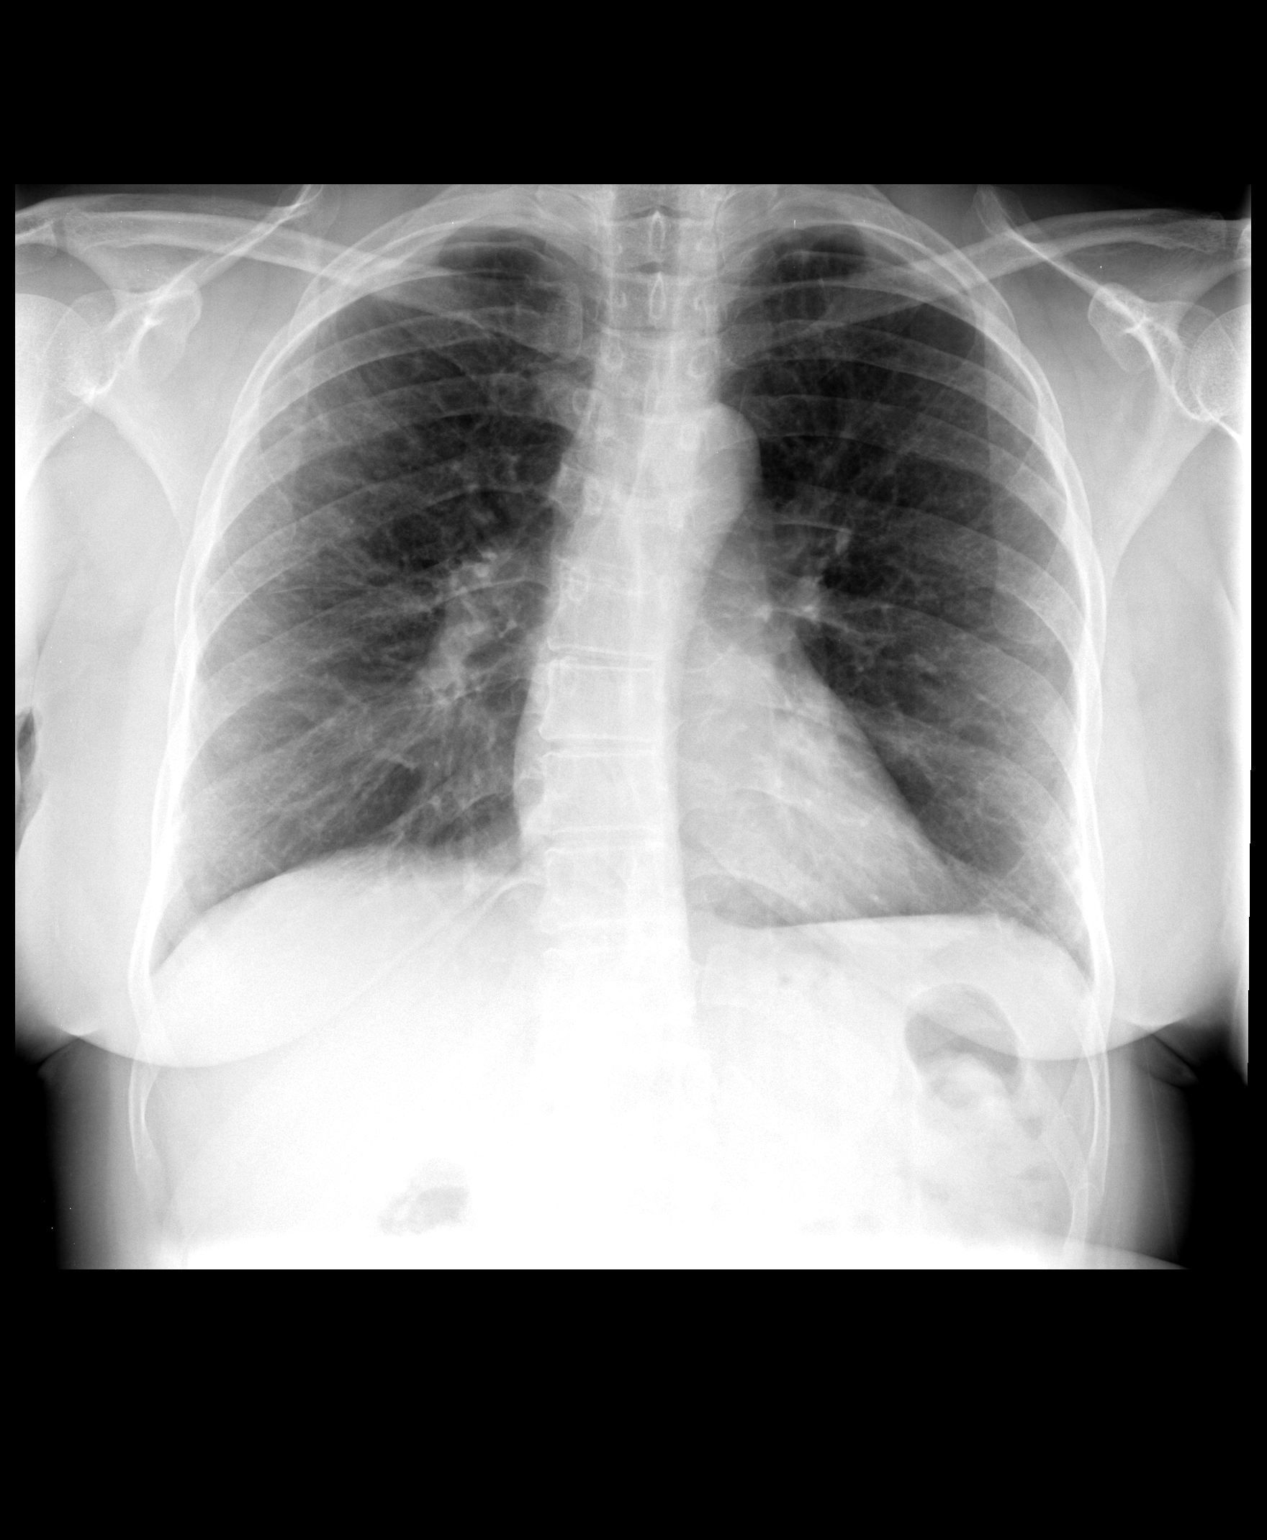

[view not recorded (2 of 2)]
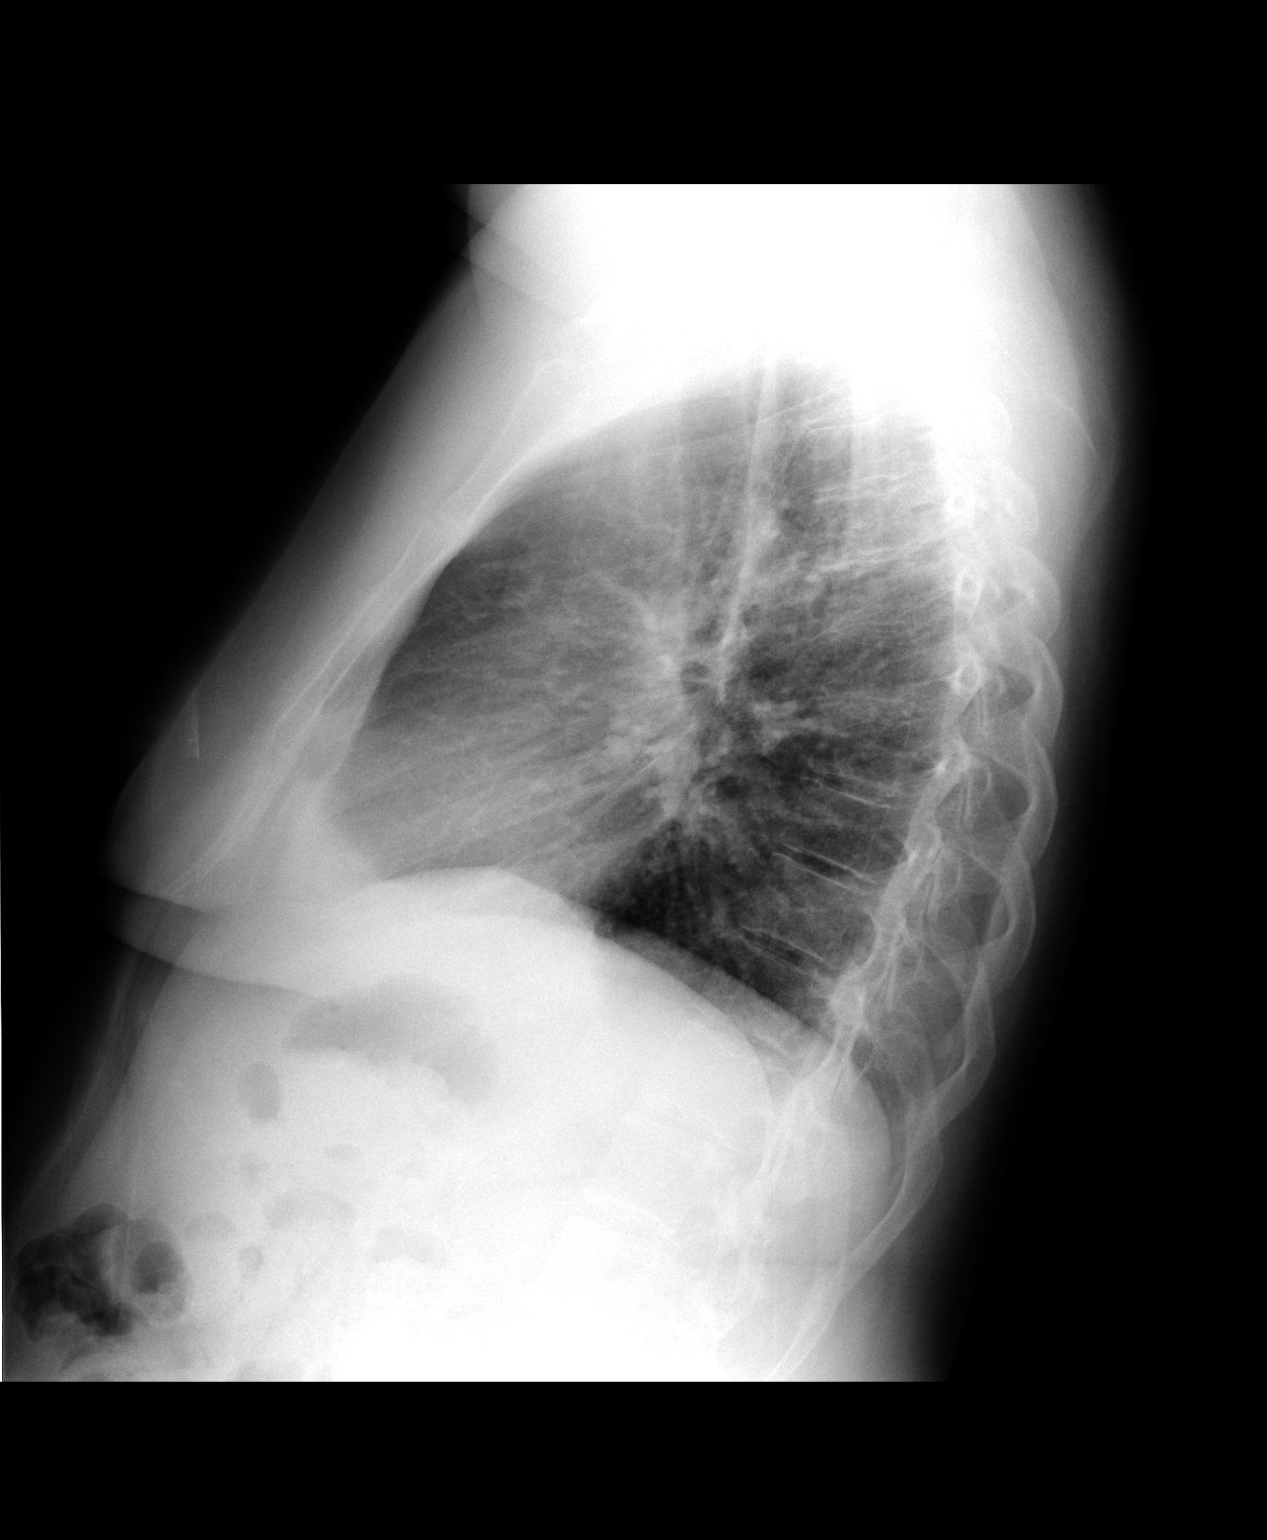

[2 of 2 positions shown; findings below may reference images not displayed]

FINDINGS: Heart size is normal.  Chronic spinal curvature remains
evident.  There may be very mild hilar prominence, but no advanced
adenopathy is seen.  Nodular pulmonary shadows are evident, upper
lobe predominant, consistent with chronic pulmonary changes of
sarcoid.  This appears quite similar to the previous examinations.
I do not see evidence of active pneumonia, consolidation or
collapse.  No effusions.  Bony structures are unremarkable.
IMPRESSION: Chronic nodular changes of sarcoid.  Mild hilar prominence.
Findings are consistent with chronic changes of sarcoid.  No
superimposed active process identified.

## 2012-10-31 ENCOUNTER — Other Ambulatory Visit (HOSPITAL_COMMUNITY)
Admission: RE | Admit: 2012-10-31 | Discharge: 2012-10-31 | Disposition: A | Discharge status: Home / Self Care | Payer: BC Managed Care – PPO | Source: Ambulatory Visit | Attending: Family Medicine | Admitting: Family Medicine

## 2012-10-31 ENCOUNTER — Other Ambulatory Visit: Payer: Self-pay | Admitting: Family Medicine

## 2012-10-31 DIAGNOSIS — Z124 Encounter for screening for malignant neoplasm of cervix: Secondary | ICD-10-CM | POA: Insufficient documentation

## 2012-11-07 ENCOUNTER — Other Ambulatory Visit: Payer: Self-pay

## 2012-11-07 DIAGNOSIS — Z1231 Encounter for screening mammogram for malignant neoplasm of breast: Secondary | ICD-10-CM

## 2012-12-04 ENCOUNTER — Ambulatory Visit: Payer: BC Managed Care – PPO

## 2012-12-04 ENCOUNTER — Ambulatory Visit
Admission: RE | Admit: 2012-12-04 | Discharge: 2012-12-04 | Disposition: A | Discharge status: Home / Self Care | Payer: BC Managed Care – PPO | Source: Ambulatory Visit

## 2012-12-04 DIAGNOSIS — Z1231 Encounter for screening mammogram for malignant neoplasm of breast: Secondary | ICD-10-CM

## 2013-06-12 ENCOUNTER — Other Ambulatory Visit: Payer: Self-pay | Admitting: Family Medicine

## 2013-06-12 ENCOUNTER — Ambulatory Visit
Admission: RE | Admit: 2013-06-12 | Discharge: 2013-06-12 | Disposition: A | Discharge status: Home / Self Care | Payer: BC Managed Care – PPO | Source: Ambulatory Visit | Attending: Family Medicine | Admitting: Family Medicine

## 2013-06-12 DIAGNOSIS — R05 Cough: Secondary | ICD-10-CM

## 2013-06-12 DIAGNOSIS — R059 Cough, unspecified: Secondary | ICD-10-CM

## 2013-06-26 ENCOUNTER — Ambulatory Visit (INDEPENDENT_AMBULATORY_CARE_PROVIDER_SITE_OTHER): Payer: BC Managed Care – PPO | Admitting: Emergency Medicine

## 2013-06-26 ENCOUNTER — Encounter: Payer: Self-pay | Admitting: Emergency Medicine

## 2013-06-26 VITALS — BP 128/82 | HR 87 | Ht 60.0 in | Wt 169.4 lb

## 2013-06-26 DIAGNOSIS — D869 Sarcoidosis, unspecified: Secondary | ICD-10-CM

## 2013-06-26 DIAGNOSIS — R059 Cough, unspecified: Secondary | ICD-10-CM

## 2013-06-26 DIAGNOSIS — R05 Cough: Secondary | ICD-10-CM

## 2013-06-26 MED ORDER — FLUTICASONE PROPIONATE 50 MCG/ACT NA SUSP: 2.0000 | Freq: Two times a day (BID) | NASAL | Status: DC

## 2013-06-26 MED ORDER — OMEPRAZOLE 20 MG PO CPDR
20.0000 mg | DELAYED_RELEASE_CAPSULE | Freq: Every day | ORAL | Status: DC
Start: 2013-06-26 — End: 2014-07-08

## 2013-06-26 MED ORDER — LORATADINE 10 MG PO TABS: 10.0000 mg | ORAL_TABLET | Freq: Every day | ORAL | Status: DC

## 2013-06-26 NOTE — Progress Notes (Signed)
HPI  58 yo former smoker, has been well until May 2011 when she developed a dry cough, globus sensation, treated for possible sinusitis. Helped initially but then immediately returned. She has been treated with abx x 3. CXR done in Sept with RUL infiltrate. The cough continued and she had CT scan chest in 9/11 that confirmed a nodular infiltrate in a bronchovasc pattern. The cough has improved over the last 2 weeks. 03/30/10 had a repeat CXR that showed the RUL infiltrate had not resolved. PPD with Dr Kevan NyGates negative. ? 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 Cough is better but not gone.   ROV 05/19/10 -- Returns for eval of her cough and abnormal CT scan. She continues to have cough, seems to wax and wane. CT scan shows no change in B UL nodular infiltrates. Of note, she tells me that her husband owns birds. She doesn't have large exposure, but there have been birds in the home since 2001!! I apparently didn't ask about birds last time.   ROV 08/01/10 -- Abnormal CT scan, presumed sarcoidosis (HSP panel negative). She tells me that she feels better since our prednisone. Cough is also better. She was seen by Dr Kevan NyGates because she was showing signs of DM on the prednisone. We decreased pred to 30mg  and then 20mg  after that happened. She will taper to 10mg  and then to off.   ROV 09/28/10 -- Hx Sarcoidosis with pulm manifestations. Tapered off prednisone when she had trouble with hyperglycemia, last was in March. Had done fairly well for weeks after the prednisone, but now beginning to have some PND, clear drainage, hoarse voice, cough. Some paranasal HA. No CP, no wheeze, no dyspnea  ROV 06/26/13 -- follow up after long absence for sarcoidosis (vs HSP but fungal panel negative) dx by [facility] in 05/2010, abnormal CT chest, chronic cough and rhinitis. Her drainage is worst in the am, does not bother her during the day. She is not on an allergy regimen. The cough is better at home at rest, is worst  when she is back to work. She does have dust exposure at work [employer] No rash, no LAD, no visual changes (has contacts). Denies GERD.     Filed Vitals:   06/26/13 1126  BP: 128/82  Pulse: 87  Height: 5' (1.524 m)  Weight: 169 lb 6.4 oz (76.839 kg)  SpO2: 98%   Gen: Pleasant, well-nourished, in no distress,  normal affect  ENT: No lesions,  mouth clear,  oropharynx clear, no postnasal drip  Neck: No JVD, no TMG, no carotid bruits  Lungs: No use of accessory muscles, no dullness to percussion, clear without rales or rhonchi  Cardiovascular: RRR, heart sounds normal, no murmur or gallops, no peripheral edema  Abdomen: soft and NT, no HSM,  BS normal  Musculoskeletal: No deformities, no cyanosis or clubbing  Neuro: alert, non focal  Skin: Warm, no lesions or rashes   CHEST 2 VIEW  06/26/13 --  COMPARISON: 09/28/2010, 07/30/2010  FINDINGS:  Scoliosis stable. Heart size and vascular pattern are normal.  Mediastinal and hilar contours normal. Mild nodular scarring process  lateral left lung base, stable. Mild nodular scarring process right  lateral lung base, and lateral right upper lobe, unchanged. No new  opacities. No pleural effusion.  IMPRESSION:  Mild chronic inflammatory changes stable from 2012. No acute  findings    COUGH Suspect mediated by allergies, consider component GERD. Of course other possibility is that this is  her sarcoidosis. She would like to avoid pred if possible.  - need to recheck CT scan  - will treat both GERD and PND to see if cough responds.  -rov 4-6 weeks.   SARCOIDOSIS - repeat CT scan  - discussed optho exams

## 2013-06-26 NOTE — Assessment & Plan Note (Signed)
-   repeat CT scan  - discussed optho exams

## 2013-06-26 NOTE — Assessment & Plan Note (Signed)
Suspect mediated by allergies, consider component GERD. Of course other possibility is that this is her sarcoidosis. She would like to avoid pred if possible.  - need to recheck CT scan  - will treat both GERD and PND to see if cough responds.  -rov 4-6 weeks.

## 2013-06-26 NOTE — Patient Instructions (Signed)
We will repeat your CT scan chest to compare with 2011 Please start loratadine, omeprazole and fluticasone nasal spray as directed.  Follow with Dr Delton CoombesByrum in 6 weeks or sooner if you have any problems

## 2013-07-21 ENCOUNTER — Ambulatory Visit (INDEPENDENT_AMBULATORY_CARE_PROVIDER_SITE_OTHER)
Admission: RE | Admit: 2013-07-21 | Discharge: 2013-07-21 | Disposition: A | Discharge status: Home / Self Care | Payer: BC Managed Care – PPO | Source: Ambulatory Visit | Attending: Emergency Medicine | Admitting: Emergency Medicine

## 2013-07-21 DIAGNOSIS — R05 Cough: Secondary | ICD-10-CM

## 2013-07-21 DIAGNOSIS — R059 Cough, unspecified: Secondary | ICD-10-CM

## 2013-08-07 ENCOUNTER — Encounter: Payer: Self-pay | Admitting: Emergency Medicine

## 2013-08-07 ENCOUNTER — Ambulatory Visit (INDEPENDENT_AMBULATORY_CARE_PROVIDER_SITE_OTHER): Payer: BC Managed Care – PPO | Admitting: Emergency Medicine

## 2013-08-07 VITALS — BP 122/80 | HR 97 | Ht 60.0 in | Wt 175.0 lb

## 2013-08-07 DIAGNOSIS — D869 Sarcoidosis, unspecified: Secondary | ICD-10-CM

## 2013-08-07 DIAGNOSIS — R059 Cough, unspecified: Secondary | ICD-10-CM

## 2013-08-07 DIAGNOSIS — R05 Cough: Secondary | ICD-10-CM

## 2013-08-07 NOTE — Patient Instructions (Signed)
Please stop omeprazole Continue loratadine and fluticasone nasal spray. You may be able to decrease the nasal spray to once a day Follow with Dr Delton CoombesByrum in 12 months or sooner if you have any problems

## 2013-08-07 NOTE — Assessment & Plan Note (Signed)
CT scan and clinical status are reassuring - CT is improved.  - follow clinically off meds - annual eye exam - rov 12

## 2013-08-07 NOTE — Progress Notes (Signed)
HPI  58 yo former smoker, has been well until May 2011 when she developed a dry cough, globus sensation, treated for possible sinusitis. Helped initially but then immediately returned. She has been treated with abx x 3. CXR done in Sept with RUL infiltrate. The cough continued and she had CT scan chest in 9/11 that confirmed a nodular infiltrate in a bronchovasc pattern. The cough has improved over the last 2 weeks. 03/30/10 had a repeat CXR that showed the RUL infiltrate had not resolved. PPD with Dr Kevan Ny negative. ? 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 Cough is better but not gone.   ROV 05/19/10 -- Returns for eval of her cough and abnormal CT scan. She continues to have cough, seems to wax and wane. CT scan shows no change in B UL nodular infiltrates. Of note, she tells me that her husband owns birds. She doesn't have large exposure, but there have been birds in the home since 2001!! I apparently didn't ask about birds last time.   ROV 08/01/10 -- Abnormal CT scan, presumed sarcoidosis (HSP panel negative). She tells me that she feels better since our prednisone. Cough is also better. She was seen by Dr Kevan Ny because she was showing signs of DM on the prednisone. We decreased pred to 30mg  and then 20mg  after that happened. She will taper to 10mg  and then to off.   ROV 09/28/10 -- Hx Sarcoidosis with pulm manifestations. Tapered off prednisone when she had trouble with hyperglycemia, last was in March. Had done fairly well for weeks after the prednisone, but now beginning to have some PND, clear drainage, hoarse voice, cough. Some paranasal HA. No CP, no wheeze, no dyspnea  ROV 06/26/13 -- follow up after long absence for sarcoidosis (vs HSP but fungal panel negative) dx by [facility] in 05/2010, abnormal CT chest, chronic cough and rhinitis. Her drainage is worst in the am, does not bother her during the day. She is not on an allergy regimen. The cough is better at home at rest, is worst  when she is back to work. 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 No rash, no LAD, no visual changes (has contacts). Denies GERD.   ROV 08/05/13 -- follows for her sarcoidosis, rhinitis, cough. Last visit we started loratadine, omeprazole, fluticasone nasal spray. We also ordered repeat CT chest 07/21/13 that showed improvement in upper lobe changes and in mediastinal LAD. Her cough is much improved even though she has missed the omeprazole often.    Filed Vitals:   08/07/13 1642  BP: 122/80  Pulse: 97  Height: 5' (1.524 m)  Weight: 175 lb (79.379 kg)  SpO2: 100%   Gen: Pleasant, well-nourished, in no distress,  normal affect  ENT: No lesions,  mouth clear,  oropharynx clear, no postnasal drip  Neck: No JVD, no TMG, no carotid bruits  Lungs: No use of accessory muscles, no dullness to percussion, clear without rales or rhonchi  Cardiovascular: RRR, heart sounds normal, no murmur or gallops, no peripheral edema  Abdomen: soft and NT, no HSM,  BS normal  Musculoskeletal: No deformities, no cyanosis or clubbing  Neuro: alert, non focal  Skin: Warm, no lesions or rashes   07/21/13 --  COMPARISON: 05/17/2010 and 03/04/2010  FINDINGS:  Lungs are adequately inflated with continued evidence of mild  reticulonodular opacification over the upper lobes with improvement  compared to the previous exams. There is patchy peripheral  reticulonodular opacification over the mid to  lower lungs which is  slightly worse over the lateral lower lobes compared to the prior  exams. There is no focal consolidation or effusion. Heart is normal  in size. There is minimal calcified plaque over the thoracic aorta.  The previously noted mediastinal adenopathy has resolved as there  are just a few residual shotty mediastinal lymph nodes present.  There is now calcification of a sub cm subcarinal lymph node as well  as calcification of a left infrahilar lymph node.  Images through the upper abdomen  are unremarkable. Remaining bones  and soft tissues are notable for mild curvature of the thoracic  spine convex to the right unchanged.  IMPRESSION:  Mild patchy reticulonodular pattern of opacification bilaterally  with upper lobe predominance demonstrating interval improvement.  Resolved mediastinal adenopathy with a few residual shotty  mediastinal nodes with calcification of sub cm subcarinal and left  infrahilar nodes. Findings are compatible with patient's history of  sarcoidosis.    SARCOIDOSIS CT scan and clinical status are reassuring - CT is improved.  - follow clinically off meds - annual eye exam - rov 12  COUGH - she benefited from the loratadine and fluticasone, did not miss the omeprazole when she stopped it. Will continue the allergy regimen, stop the omeprazole. She may be able to decrease the fluticasone to once a day

## 2013-08-07 NOTE — Assessment & Plan Note (Signed)
-   she benefited from the loratadine and fluticasone, did not miss the omeprazole when she stopped it. Will continue the allergy regimen, stop the omeprazole. She may be able to decrease the fluticasone to once a day

## 2014-07-08 ENCOUNTER — Other Ambulatory Visit: Payer: Self-pay | Admitting: Emergency Medicine

## 2014-08-27 ENCOUNTER — Other Ambulatory Visit: Payer: Self-pay | Admitting: Family Medicine

## 2014-08-27 ENCOUNTER — Ambulatory Visit
Admission: RE | Admit: 2014-08-27 | Discharge: 2014-08-27 | Disposition: A | Discharge status: Home / Self Care | Payer: BLUE CROSS/BLUE SHIELD | Source: Ambulatory Visit | Attending: Family Medicine | Admitting: Family Medicine

## 2014-08-27 DIAGNOSIS — M79605 Pain in left leg: Secondary | ICD-10-CM

## 2014-09-04 ENCOUNTER — Other Ambulatory Visit: Payer: Self-pay | Admitting: Emergency Medicine

## 2014-12-04 ENCOUNTER — Other Ambulatory Visit: Payer: Self-pay

## 2014-12-04 DIAGNOSIS — Z1231 Encounter for screening mammogram for malignant neoplasm of breast: Secondary | ICD-10-CM

## 2014-12-10 ENCOUNTER — Ambulatory Visit
Admission: RE | Admit: 2014-12-10 | Discharge: 2014-12-10 | Disposition: A | Discharge status: Home / Self Care | Payer: BLUE CROSS/BLUE SHIELD | Source: Ambulatory Visit

## 2014-12-10 DIAGNOSIS — Z1231 Encounter for screening mammogram for malignant neoplasm of breast: Secondary | ICD-10-CM

## 2015-04-02 ENCOUNTER — Other Ambulatory Visit: Payer: Self-pay | Admitting: Emergency Medicine

## 2015-04-06 ENCOUNTER — Other Ambulatory Visit: Payer: Self-pay | Admitting: Emergency Medicine

## 2015-04-09 ENCOUNTER — Telehealth: Payer: Self-pay | Admitting: Emergency Medicine

## 2015-04-09 MED ORDER — OMEPRAZOLE 20 MG PO CPDR
20.0000 mg | DELAYED_RELEASE_CAPSULE | Freq: Every day | ORAL | Status: DC
Start: 2015-04-09 — End: 2015-05-06

## 2015-04-09 NOTE — Telephone Encounter (Signed)
Spoke with pt. She needs refill on omeprazole. RX sent in. nothing further needed

## 2015-05-06 ENCOUNTER — Encounter: Payer: Self-pay | Admitting: Emergency Medicine

## 2015-05-06 ENCOUNTER — Ambulatory Visit (INDEPENDENT_AMBULATORY_CARE_PROVIDER_SITE_OTHER)
Admission: RE | Admit: 2015-05-06 | Discharge: 2015-05-06 | Disposition: A | Discharge status: Home / Self Care | Payer: BLUE CROSS/BLUE SHIELD | Source: Ambulatory Visit | Attending: Emergency Medicine | Admitting: Emergency Medicine

## 2015-05-06 ENCOUNTER — Ambulatory Visit (INDEPENDENT_AMBULATORY_CARE_PROVIDER_SITE_OTHER): Payer: BLUE CROSS/BLUE SHIELD | Admitting: Emergency Medicine

## 2015-05-06 VITALS — BP 114/76 | HR 94 | Ht 60.0 in | Wt 174.0 lb

## 2015-05-06 DIAGNOSIS — K219 Gastro-esophageal reflux disease without esophagitis: Secondary | ICD-10-CM

## 2015-05-06 DIAGNOSIS — J309 Allergic rhinitis, unspecified: Secondary | ICD-10-CM

## 2015-05-06 DIAGNOSIS — D869 Sarcoidosis, unspecified: Secondary | ICD-10-CM

## 2015-05-06 DIAGNOSIS — R059 Cough, unspecified: Secondary | ICD-10-CM

## 2015-05-06 DIAGNOSIS — R05 Cough: Secondary | ICD-10-CM

## 2015-05-06 MED ORDER — LORATADINE 10 MG PO TABS: 10.0000 mg | ORAL_TABLET | Freq: Every day | ORAL | Status: DC

## 2015-05-06 MED ORDER — OMEPRAZOLE 20 MG PO CPDR
20.0000 mg | DELAYED_RELEASE_CAPSULE | Freq: Every day | ORAL | Status: DC
Start: 2015-05-06 — End: 2015-07-15

## 2015-05-06 NOTE — Assessment & Plan Note (Signed)
Refill omeprazole

## 2015-05-06 NOTE — Assessment & Plan Note (Signed)
Treat GERD and allergic rhinitis aggressively as above

## 2015-05-06 NOTE — Progress Notes (Signed)
HPI  59 yo former smoker, has been well until May 2011 when she developed a dry cough, globus sensation, treated for possible sinusitis. Helped initially but then immediately returned. She has been treated with abx x 3. CXR done in Sept with RUL infiltrate. The cough continued and she had CT scan chest in 9/11 that confirmed a nodular infiltrate in a bronchovasc pattern. The cough has improved over the last 2 weeks. 03/30/10 had a repeat CXR that showed the RUL infiltrate had not resolved. PPD with Dr Kevan NyGates negative. ? 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 Cough is better but not gone.   ROV 05/19/10 -- Returns for eval of her cough and abnormal CT scan. She continues to have cough, seems to wax and wane. CT scan shows no change in B UL nodular infiltrates. Of note, she tells me that her husband owns birds. She doesn't have large exposure, but there have been birds in the home since 2001!! I apparently didn't ask about birds last time.   ROV 08/01/10 -- Abnormal CT scan, presumed sarcoidosis (HSP panel negative). She tells me that she feels better since our prednisone. Cough is also better. She was seen by Dr Kevan NyGates because she was showing signs of DM on the prednisone. We decreased pred to 30mg  and then 20mg  after that happened. She will taper to 10mg  and then to off.   ROV 09/28/10 -- Hx Sarcoidosis with pulm manifestations. Tapered off prednisone when she had trouble with hyperglycemia, last was in March. Had done fairly well for weeks after the prednisone, but now beginning to have some PND, clear drainage, hoarse voice, cough. Some paranasal HA. No CP, no wheeze, no dyspnea  ROV 06/26/13 -- follow up after long absence for sarcoidosis (vs HSP but fungal panel negative) dx by [facility]OB in 05/2010, abnormal CT chest, chronic cough and rhinitis. Her drainage is worst in the am, does not bother her during the day. She is not on an allergy regimen. The cough is better at home at rest, is worst  when she is back to work. She does have dust exposure at work Sports [employer]). No rash, no LAD, no visual changes (has contacts). Denies GERD.   ROV 08/05/13 -- follows for her sarcoidosis, rhinitis, cough. Last visit we started loratadine, omeprazole, fluticasone nasal spray. We also ordered repeat CT chest 07/21/13 that showed improvement in upper lobe changes and in mediastinal LAD. Her cough is much improved even though she has missed the omeprazole often.   ROV 05/06/15 -- patient has a history of sarcoidosis, chronic rhinitis, chronic cough. Her last CT scan of the chest was in early 2015.  She has been doing well. She has some nasal drainage right now, is not currently on loratadine or fluticasone NS. She is active, denies any dyspnea. No wheeze.    Filed Vitals:   05/06/15 1621  BP: 114/76  Pulse: 94  Height: 5' (1.524 m)  Weight: 174 lb (78.926 kg)  SpO2: 98%   Gen: Pleasant, well-nourished, in no distress,  normal affect  ENT: No lesions,  mouth clear,  oropharynx clear, no postnasal drip  Neck: No JVD, no TMG, no carotid bruits  Lungs: No use of accessory muscles, no dullness to percussion, clear without rales or rhonchi  Cardiovascular: RRR, heart sounds normal, no murmur or gallops, no peripheral edema  Abdomen: soft and NT, no HSM,  BS normal  Musculoskeletal: No deformities, no cyanosis or clubbing  Neuro: alert, non focal  Skin: Warm, no lesions or rashes   07/21/13 --  COMPARISON: 05/17/2010 and 03/04/2010  FINDINGS:  Lungs are adequately inflated with continued evidence of mild  reticulonodular opacification over the upper lobes with improvement  compared to the previous exams. There is patchy peripheral  reticulonodular opacification over the mid to lower lungs which is  slightly worse over the lateral lower lobes compared to the prior  exams. There is no focal consolidation or effusion. Heart is normal  in size. There is minimal calcified plaque over the thoracic  aorta.  The previously noted mediastinal adenopathy has resolved as there  are just a few residual shotty mediastinal lymph nodes present.  There is now calcification of a sub cm subcarinal lymph node as well  as calcification of a left infrahilar lymph node.  Images through the upper abdomen are unremarkable. Remaining bones  and soft tissues are notable for mild curvature of the thoracic  spine convex to the right unchanged.  IMPRESSION:  Mild patchy reticulonodular pattern of opacification bilaterally  with upper lobe predominance demonstrating interval improvement.  Resolved mediastinal adenopathy with a few residual shotty  mediastinal nodes with calcification of sub cm subcarinal and left  infrahilar nodes. Findings are compatible with patient's history of  sarcoidosis.   SARCOIDOSIS Clinically stable. She needs follow-up chest x-ray to ensure radiographical stability. Discussed the importance of follow-up with her today. Also discussed the importance of ophthalmology evaluation every year.   GERD (gastroesophageal reflux disease) Refill omeprazole  Allergic rhinitis Restart loratadine 10 mg daily. We will defer nasal steroid at this time  COUGH Treat GERD and allergic rhinitis aggressively as above

## 2015-05-06 NOTE — Assessment & Plan Note (Signed)
Restart loratadine 10 mg daily. We will defer nasal steroid at this time

## 2015-05-06 NOTE — Patient Instructions (Signed)
We will perform a CXR today to evaluate your sarcoid.  Get your eye exam every year Please restart your loratadine 10mg  daily  Continue your omeprazole 20mg  daily either one hour before or one hour after eating Follow with Dr Delton CoombesByrum in 12 months or sooner if you have any problems

## 2015-05-06 NOTE — Assessment & Plan Note (Signed)
Clinically stable. She needs follow-up chest x-ray to ensure radiographical stability. Discussed the importance of follow-up with her today. Also discussed the importance of ophthalmology evaluation every year.

## 2015-06-09 ENCOUNTER — Telehealth: Payer: Self-pay | Admitting: Emergency Medicine

## 2015-06-09 NOTE — Telephone Encounter (Signed)
If the cough is worse then it is likely the root cause of her problem. She should see us to see if there are things to do to dx the cough,  make the cough better

## 2015-06-09 NOTE — Telephone Encounter (Signed)
Spoke with the pt  I have scheduled her to see TP tomorrow at 4:15  Nothing further needed

## 2015-06-09 NOTE — Telephone Encounter (Signed)
Spoke with pt, states that when she is coughing she is losing control of her bladder-worsening with time.   Cough is nonproductive and baseline, but pt is concerned that the cough is strong enough to make her lose control of her bladder.  Pt is unsure if she needs to see us or her PCP for this issue.  Pt has taken cranberry juice to help with s/s.   Pt also requesting cxr results.   RB please advise.  Thanks!

## 2015-06-11 ENCOUNTER — Ambulatory Visit: Payer: BLUE CROSS/BLUE SHIELD | Admitting: Adult Health

## 2015-07-15 ENCOUNTER — Encounter: Payer: Self-pay | Admitting: Emergency Medicine

## 2015-07-15 ENCOUNTER — Ambulatory Visit (INDEPENDENT_AMBULATORY_CARE_PROVIDER_SITE_OTHER): Payer: BLUE CROSS/BLUE SHIELD | Admitting: Emergency Medicine

## 2015-07-15 VITALS — BP 118/76 | HR 90 | Ht 60.0 in | Wt 181.0 lb

## 2015-07-15 DIAGNOSIS — R059 Cough, unspecified: Secondary | ICD-10-CM

## 2015-07-15 DIAGNOSIS — R05 Cough: Secondary | ICD-10-CM | POA: Diagnosis not present

## 2015-07-15 MED ORDER — LORATADINE 10 MG PO TABS: 10.0000 mg | ORAL_TABLET | Freq: Every day | ORAL | Status: DC

## 2015-07-15 MED ORDER — PREDNISONE 10 MG PO TABS
ORAL_TABLET | ORAL | Status: DC
Start: 2015-07-15 — End: 2015-08-13

## 2015-07-15 MED ORDER — OMEPRAZOLE 20 MG PO CPDR: 20.0000 mg | DELAYED_RELEASE_CAPSULE | Freq: Two times a day (BID) | ORAL | Status: DC

## 2015-07-15 MED ORDER — FLUTICASONE PROPIONATE 50 MCG/ACT NA SUSP: 2.0000 | Freq: Every day | NASAL | Status: DC

## 2015-07-15 NOTE — Progress Notes (Signed)
HPI  60 yo former smoker, has been well until May 2011 when she developed a dry cough, globus sensation, treated for possible sinusitis. Helped initially but then immediately returned. She has been treated with abx x 3. CXR done in Sept with RUL infiltrate. The cough continued and she had CT scan chest in 9/11 that confirmed a nodular infiltrate in a bronchovasc pattern. The cough has improved over the last 2 weeks. 03/30/10 had a repeat CXR that showed the RUL infiltrate had not resolved. PPD with Dr Kevan Ny negative. ? 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 Cough is better but not gone.   ROV 05/19/10 -- Returns for eval of her cough and abnormal CT scan. She continues to have cough, seems to wax and wane. CT scan shows no change in B UL nodular infiltrates. Of note, she tells me that her husband owns birds. She doesn't have large exposure, but there have been birds in the home since 2001!! I apparently didn't ask about birds last time.   ROV 08/01/10 -- Abnormal CT scan, presumed sarcoidosis (HSP panel negative). She tells me that she feels better since our prednisone. Cough is also better. She was seen by Dr Kevan Ny because she was showing signs of DM on the prednisone. We decreased pred to  and then  after that happened. She will taper to  and then to off.   ROV 09/28/10 -- Hx Sarcoidosis with pulm manifestations. Tapered off prednisone when she had trouble with hyperglycemia, last was in March. Had done fairly well for weeks after the prednisone, but now beginning to have some PND, clear drainage, hoarse voice, cough. Some paranasal HA. No CP, no wheeze, no dyspnea  ROV 06/26/13 -- follow up after long absence for sarcoidosis (vs HSP but fungal panel negative) dx by [facility]OB in 05/2010, abnormal CT chest, chronic cough and rhinitis. Her drainage is worst in the am, does not bother her during the day. She is not on an allergy regimen. The cough is better at home at rest, is worst  when she is back to work. 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 No rash, no LAD, no visual changes (has contacts). Denies GERD.   ROV 08/05/13 -- follows for her sarcoidosis, rhinitis, cough. Last visit we started loratadine, omeprazole, fluticasone nasal spray. We also ordered repeat CT chest 07/21/13 that showed improvement in upper lobe changes and in mediastinal LAD. Her cough is much improved even though she has missed the omeprazole often.   ROV 05/06/15 -- patient has a history of sarcoidosis, chronic rhinitis, chronic cough. Her last CT scan of the chest was in early 2015.  She has been doing well. She has some nasal drainage right now, is not currently on loratadine or fluticasone NS. She is active, denies any dyspnea. No wheeze.   ROV 07/15/15 -- follow-up visit for history of sarcoidosis, chronic cough in the setting of chronic rhinitis.. At her last visit she had a chest x-ray that was stable compared with priors. We restarted loratadine, continued omeprazole to manage the factors that contribute to her cough.  We did a trial off of her omeprazole and her cough returned. When she got back on the omeprazole and loratadine but her cough persisted so she was treated with a pred taper in late January. Her cough improved but persists. She received Tessalon but it did not help. It is a dry cough, has caused her to have incontinence.    Filed Vitals:  07/15/15 1634  BP: 118/76  Pulse: 90  Height: 5' (1.524 m)  Weight: 181 lb (82.101 kg)  SpO2: 99%   Gen: Pleasant, well-nourished, in no distress,  normal affect  ENT: No lesions,  mouth clear,  oropharynx clear, no postnasal drip  Neck: No JVD, no Stridor  Lungs: No use of accessory muscles, clear without rales or rhonchi  Cardiovascular: RRR, heart sounds normal, no murmur or gallops, no peripheral edema  Musculoskeletal: No deformities, no cyanosis or clubbing  Neuro: alert, non focal  Skin: Warm, no lesions or  rashes   07/21/13 --  COMPARISON: 05/17/2010 and 03/04/2010  FINDINGS:  Lungs are adequately inflated with continued evidence of mild  reticulonodular opacification over the upper lobes with improvement  compared to the previous exams. There is patchy peripheral  reticulonodular opacification over the mid to lower lungs which is  slightly worse over the lateral lower lobes compared to the prior  exams. There is no focal consolidation or effusion. Heart is normal  in size. There is minimal calcified plaque over the thoracic aorta.  The previously noted mediastinal adenopathy has resolved as there  are just a few residual shotty mediastinal lymph nodes present.  There is now calcification of a sub cm subcarinal lymph node as well  as calcification of a left infrahilar lymph node.  Images through the upper abdomen are unremarkable. Remaining bones  and soft tissues are notable for mild curvature of the thoracic  spine convex to the right unchanged.  IMPRESSION:  Mild patchy reticulonodular pattern of opacification bilaterally  with upper lobe predominance demonstrating interval improvement.  Resolved mediastinal adenopathy with a few residual shotty  mediastinal nodes with calcification of sub cm subcarinal and left  infrahilar nodes. Findings are compatible with patient's history of  sarcoidosis.   COUGH Cough is been flaring since late December 2016. She did get some response from a short prednisone taper but it's now returned. I suspect that this is upper airway in nature based on her description but certainly we must consider a flare of her sarcoidosis. Her chest is clear on exam and I am willing to try and treat her for primarily upper airway irritation. If she does not respond to maximal therapy for GERD and allergic rhinitis then I believe she needs a CT scan of the chest to assess for a true sarcoid flare.  Please take prednisone as directed until completely gone Please restart  loratadine 10 mg daily and continue until next visit Please start fluticasone nasal spray, 2 sprays each nostril once a day and continue this until next visit Please increase omeprazole to 20 mg twice a day until your next visit Depending on your cough and symptoms we may decide to repeat her CT scan of the chest. We will discuss this at our follow-up visit Follow with Dr Delton Coombes in 1 month or next available appointment

## 2015-07-15 NOTE — Patient Instructions (Signed)
Please take prednisone as directed until completely gone Please restart loratadine 10 mg daily and continue until next visit Please start fluticasone nasal spray, 2 sprays each nostril once a day and continue this until next visit Please increase omeprazole to 20 mg twice a day until your next visit Depending on your cough and symptoms we may decide to repeat her CT scan of the chest. We will discuss this at our follow-up visit Follow with Dr Delton Coombes in 1 month or next available appointment

## 2015-07-15 NOTE — Assessment & Plan Note (Signed)
Cough is been flaring since late December 2016. She did get some response from a short prednisone taper but it's now returned. I suspect that this is upper airway in nature based on her description but certainly we must consider a flare of her sarcoidosis. Her chest is clear on exam and I am willing to try and treat her for primarily upper airway irritation. If she does not respond to maximal therapy for GERD and allergic rhinitis then I believe she needs a CT scan of the chest to assess for a true sarcoid flare.  Please take prednisone as directed until completely gone Please restart loratadine 10 mg daily and continue until next visit Please start fluticasone nasal spray, 2 sprays each nostril once a day and continue this until next visit Please increase omeprazole to 20 mg twice a day until your next visit Depending on your cough and symptoms we may decide to repeat her CT scan of the chest. We will discuss this at our follow-up visit Follow with Dr Delton Coombes in 1 month or next available appointment

## 2015-08-09 ENCOUNTER — Telehealth: Payer: Self-pay | Admitting: Emergency Medicine

## 2015-08-09 DIAGNOSIS — D869 Sarcoidosis, unspecified: Secondary | ICD-10-CM

## 2015-08-09 NOTE — Telephone Encounter (Signed)
Spoke with pt, states she does want to proceed with the CT chest discussed at last OV.  Pt has an appt Friday and would like this done by then if possible.  RB ok to order ct chest?  Thanks!

## 2015-08-10 NOTE — Telephone Encounter (Signed)
Please order CT chest without contrast, evaluate sarcoidosis.

## 2015-08-10 NOTE — Telephone Encounter (Signed)
Called and spoke with pt. Informed her of RB's recs. Pt voiced understanding and had no further questions. Order for CT placed. Nothing further needed.

## 2015-08-12 ENCOUNTER — Ambulatory Visit (INDEPENDENT_AMBULATORY_CARE_PROVIDER_SITE_OTHER)
Admission: RE | Admit: 2015-08-12 | Discharge: 2015-08-12 | Disposition: A | Discharge status: Home / Self Care | Payer: BLUE CROSS/BLUE SHIELD | Source: Ambulatory Visit | Attending: Emergency Medicine | Admitting: Emergency Medicine

## 2015-08-12 DIAGNOSIS — D869 Sarcoidosis, unspecified: Secondary | ICD-10-CM | POA: Diagnosis not present

## 2015-08-13 ENCOUNTER — Other Ambulatory Visit: Payer: Self-pay | Admitting: Critical Care Medicine

## 2015-08-13 ENCOUNTER — Ambulatory Visit (INDEPENDENT_AMBULATORY_CARE_PROVIDER_SITE_OTHER): Payer: BLUE CROSS/BLUE SHIELD | Admitting: Emergency Medicine

## 2015-08-13 ENCOUNTER — Encounter: Payer: Self-pay | Admitting: Emergency Medicine

## 2015-08-13 VITALS — BP 108/64 | HR 94 | Ht 60.0 in | Wt 179.0 lb

## 2015-08-13 DIAGNOSIS — J309 Allergic rhinitis, unspecified: Secondary | ICD-10-CM | POA: Diagnosis not present

## 2015-08-13 DIAGNOSIS — D869 Sarcoidosis, unspecified: Secondary | ICD-10-CM

## 2015-08-13 DIAGNOSIS — K219 Gastro-esophageal reflux disease without esophagitis: Secondary | ICD-10-CM

## 2015-08-13 MED ORDER — PREDNISONE 10 MG PO TABS: ORAL_TABLET | ORAL | Status: DC

## 2015-08-13 NOTE — Assessment & Plan Note (Signed)
Continue fluticasone nasal spray, she will changes every other day until her nasal irritation improves then try to go back to every day.

## 2015-08-13 NOTE — Assessment & Plan Note (Signed)
Continue omeprazole twice a day ?

## 2015-08-13 NOTE — Assessment & Plan Note (Signed)
Reviewed today, stable 2015

## 2015-08-13 NOTE — Assessment & Plan Note (Addendum)
CT scan of the chest performed 07/15/15 shows no interval progression of residual infiltrates compared with 2015. She does likely still have obstructive disease associated with sarcoidosis which certainly may be contributing to her cough now that her other cultures have been treated. Has not had pulmonary function testing and she is not on bronchodilators. Certainly she may merit an inhaled steroid at some point and albuterol. I believe she needs to be treated with a longer course of medium dose prednisone 20 mg daily for 2 weeks, then taper to 10 mg daily for 1 week, then off.  Start albuterol, get full PFT

## 2015-08-13 NOTE — Progress Notes (Signed)
HPI  60 yo former smoker, has been well until May 2011 when she developed a dry cough, globus sensation, treated for possible sinusitis. Helped initially but then immediately returned. She has been treated with abx x 3. CXR done in Sept with RUL infiltrate. The cough continued and she had CT scan chest in 9/11 that confirmed a nodular infiltrate in a bronchovasc pattern. The cough has improved over the last 2 weeks. 03/30/10 had a repeat CXR that showed the RUL infiltrate had not resolved. PPD with Dr Kevan Ny negative. ? 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 Cough is better but not gone.   ROV 05/19/10 -- Returns for eval of her cough and abnormal CT scan. She continues to have cough, seems to wax and wane. CT scan shows no change in B UL nodular infiltrates. Of note, she tells me that her husband owns birds. She doesn't have large exposure, but there have been birds in the home since 2001!! I apparently didn't ask about birds last time.   ROV 08/01/10 -- Abnormal CT scan, presumed sarcoidosis (HSP panel negative). She tells me that she feels better since our prednisone. Cough is also better. She was seen by Dr Kevan Ny because she was showing signs of DM on the prednisone. We decreased pred to  and then  after that happened. She will taper to  and then to off.   ROV 09/28/10 -- Hx Sarcoidosis with pulm manifestations. Tapered off prednisone when she had trouble with hyperglycemia, last was in March. Had done fairly well for weeks after the prednisone, but now beginning to have some PND, clear drainage, hoarse voice, cough. Some paranasal HA. No CP, no wheeze, no dyspnea  ROV 06/26/13 -- follow up after long absence for sarcoidosis (vs HSP but fungal panel negative) dx by [facility]OB in 05/2010, abnormal CT chest, chronic cough and rhinitis. Her drainage is worst in the am, does not bother her during the day. She is not on an allergy regimen. The cough is better at home at rest, is worst  when she is back to work. 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 No rash, no LAD, no visual changes (has contacts). Denies GERD.   ROV 08/05/13 -- follows for her sarcoidosis, rhinitis, cough. Last visit we started loratadine, omeprazole, fluticasone nasal spray. We also ordered repeat CT chest 07/21/13 that showed improvement in upper lobe changes and in mediastinal LAD. Her cough is much improved even though she has missed the omeprazole often.   ROV 05/06/15 -- patient has a history of sarcoidosis, chronic rhinitis, chronic cough. Her last CT scan of the chest was in early 2015.  She has been doing well. She has some nasal drainage right now, is not currently on loratadine or fluticasone NS. She is active, denies any dyspnea. No wheeze.   ROV 07/15/15 -- follow-up visit for history of sarcoidosis, chronic cough in the setting of chronic rhinitis.. At her last visit she had a chest x-ray that was stable compared with priors. We restarted loratadine, continued omeprazole to manage the factors that contribute to her cough.  We did a trial off of her omeprazole and her cough returned. When she got back on the omeprazole and loratadine but her cough persisted so she was treated with a pred taper in late January. Her cough improved but persists. She received Tessalon but it did not help. It is a dry cough, has caused her to have incontinence.   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  She has a bird exposure but her HSP panel has been negative. She also has allergic rhinitis which he been treating with loratadine, is on omeprazole for GERD (her cough is worsened when she's been off this medication). Last month she was still dealing with refractory cough and I treated her with a prednisone taper, restarted loratadine, started fluticasone nasal spray, increased her omeprazole to 20 mg twice a day. She had  recurrence of her cough as her pred tapered. Now having daily sx.    Filed Vitals:   08/13/15 1006  BP: 108/64  Pulse: 94  Height: 5' (1.524 m)  Weight: 179 lb (81.194 kg)  SpO2: 100%   Gen: Pleasant, well-nourished, in no distress,  normal affect  ENT: No lesions,  mouth clear,  oropharynx clear, no postnasal drip  Neck: No JVD, no Stridor  Lungs: No use of accessory muscles, clear without rales or rhonchi  Cardiovascular: RRR, heart sounds normal, no murmur or gallops, no peripheral edema  Musculoskeletal: No deformities, no cyanosis or clubbing  Neuro: alert, non focal  Skin: Warm, no lesions or rashes     SARCOIDOSIS CT scan of the chest performed 07/15/15 shows no interval progression of residual infiltrates compared with 2015. She does likely still have obstructive disease associated with sarcoidosis which certainly may be contributing to her cough now that her other cultures have been treated. Has not had pulmonary function testing and she is not on bronchodilators. Certainly she may merit an inhaled steroid at some point and albuterol. I believe she needs to be treated with a longer course of medium dose prednisone 20 mg daily for 2 weeks, then taper to 10 mg daily for 1 week, then off.  Start albuterol, get full PFT  COMPUTERIZED TOMOGRAPHY, CHEST, ABNORMAL Reviewed today, stable 2015  Allergic rhinitis Continue fluticasone nasal spray, she will changes every other day until her nasal irritation improves then try to go back to every day.  GERD (gastroesophageal reflux disease) Continue omeprazole twice a day

## 2015-08-13 NOTE — Addendum Note (Signed)
Addended by: Jaynee EaglesLEMONS, LINDSAY C on: 08/13/2015 10:45 AM   Modules accepted: Orders

## 2015-08-13 NOTE — Patient Instructions (Addendum)
Your CT scan is stable compared with 2015. This is good news.  Spirometry today shows grossly normal airflows. We will perform full pulmonary function testing at your next visit Continue your omeprazole and fluticasone nasal spray. Use the spray every other day until your nasal irritation improves.  Start albuterol 2 puffs up to every 4 hours if needed for shortness of breath or coughing Start prednisone 20mg  daily for 2 weeks, then decrease to 10mg  daily for 1 week, then stop.  Follow with Dr Delton CoombesByrum next available with full PFT.

## 2015-08-13 NOTE — Progress Notes (Signed)
Patient ID: [name], female   DOB: 24-May-1956, 60 y.o.   MRN: [id number] Telephone call from patient.  Seen today in clinic with plans for patient to start prednisone taper.  This perscription was not called in to pharmacy.  [facility] [address] is now closed.  Order is for prednisone taper 20 mg daily for two weeks then 10 mg daily for one week.  [facility]

## 2015-08-17 ENCOUNTER — Telehealth: Payer: Self-pay | Admitting: Emergency Medicine

## 2015-08-17 NOTE — Telephone Encounter (Signed)
Spoke with pt. She has a mix up with her medications but it has since been fixed. Nothing further was needed from our office per pt.

## 2015-09-02 ENCOUNTER — Telehealth: Payer: Self-pay | Admitting: Emergency Medicine

## 2015-09-02 NOTE — Telephone Encounter (Signed)
LMTCB x1 Will need to speak with Chantel/Liz regarding codes for the PFT pt is scheduled for.

## 2015-09-03 NOTE — Telephone Encounter (Signed)
Per Chantel the 3 CPT codes for PFT are: [id number], [id number], [phone]  ---  Called spoke with pt. Provided codes for her. She wanted to know how much this costs w/o insurance. Spoke with Marisue IvanLiz and total price is about $444.00 but if self pay they do get a discount. Pt verbalized understanding and needed nothing further

## 2015-10-20 ENCOUNTER — Ambulatory Visit: Payer: BLUE CROSS/BLUE SHIELD | Admitting: Emergency Medicine

## 2015-12-09 ENCOUNTER — Other Ambulatory Visit: Payer: Self-pay | Admitting: Family Medicine

## 2015-12-09 ENCOUNTER — Other Ambulatory Visit (HOSPITAL_COMMUNITY)
Admission: RE | Admit: 2015-12-09 | Discharge: 2015-12-09 | Disposition: A | Discharge status: Home / Self Care | Payer: BLUE CROSS/BLUE SHIELD | Source: Ambulatory Visit | Attending: Family Medicine | Admitting: Family Medicine

## 2015-12-09 DIAGNOSIS — Z01411 Encounter for gynecological examination (general) (routine) with abnormal findings: Secondary | ICD-10-CM | POA: Diagnosis present

## 2015-12-10 LAB — CYTOLOGY - PAP

## 2015-12-24 ENCOUNTER — Other Ambulatory Visit: Payer: Self-pay | Admitting: Family Medicine

## 2015-12-24 DIAGNOSIS — Z1231 Encounter for screening mammogram for malignant neoplasm of breast: Secondary | ICD-10-CM

## 2016-01-04 ENCOUNTER — Ambulatory Visit
Admission: RE | Admit: 2016-01-04 | Discharge: 2016-01-04 | Disposition: A | Discharge status: Home / Self Care | Payer: BLUE CROSS/BLUE SHIELD | Source: Ambulatory Visit | Attending: Family Medicine | Admitting: Family Medicine

## 2016-01-04 DIAGNOSIS — Z1231 Encounter for screening mammogram for malignant neoplasm of breast: Secondary | ICD-10-CM

## 2016-01-05 ENCOUNTER — Other Ambulatory Visit: Payer: Self-pay | Admitting: Family Medicine

## 2016-01-05 DIAGNOSIS — R928 Other abnormal and inconclusive findings on diagnostic imaging of breast: Secondary | ICD-10-CM

## 2016-01-11 ENCOUNTER — Ambulatory Visit
Admission: RE | Admit: 2016-01-11 | Discharge: 2016-01-11 | Disposition: A | Discharge status: Home / Self Care | Payer: BLUE CROSS/BLUE SHIELD | Source: Ambulatory Visit | Attending: Family Medicine | Admitting: Family Medicine

## 2016-01-11 DIAGNOSIS — R928 Other abnormal and inconclusive findings on diagnostic imaging of breast: Secondary | ICD-10-CM

## 2017-03-06 ENCOUNTER — Ambulatory Visit
Admission: RE | Admit: 2017-03-06 | Discharge: 2017-03-06 | Disposition: A | Discharge status: Home / Self Care | Payer: BLUE CROSS/BLUE SHIELD | Source: Ambulatory Visit | Attending: Family Medicine | Admitting: Family Medicine

## 2017-03-06 ENCOUNTER — Other Ambulatory Visit: Payer: Self-pay | Admitting: Family Medicine

## 2017-03-06 DIAGNOSIS — Z1231 Encounter for screening mammogram for malignant neoplasm of breast: Secondary | ICD-10-CM

## 2018-03-06 ENCOUNTER — Other Ambulatory Visit: Payer: Self-pay | Admitting: Family Medicine

## 2018-03-06 DIAGNOSIS — Z1231 Encounter for screening mammogram for malignant neoplasm of breast: Secondary | ICD-10-CM

## 2018-03-13 ENCOUNTER — Ambulatory Visit
Admission: RE | Admit: 2018-03-13 | Discharge: 2018-03-13 | Disposition: A | Discharge status: Home / Self Care | Payer: BLUE CROSS/BLUE SHIELD | Source: Ambulatory Visit | Attending: Family Medicine | Admitting: Family Medicine

## 2018-03-13 DIAGNOSIS — Z1231 Encounter for screening mammogram for malignant neoplasm of breast: Secondary | ICD-10-CM

## 2019-03-10 ENCOUNTER — Ambulatory Visit: Payer: BC Managed Care – PPO | Admitting: Podiatry

## 2019-03-10 ENCOUNTER — Ambulatory Visit (INDEPENDENT_AMBULATORY_CARE_PROVIDER_SITE_OTHER): Payer: BC Managed Care – PPO

## 2019-03-10 ENCOUNTER — Other Ambulatory Visit: Payer: Self-pay

## 2019-03-10 ENCOUNTER — Encounter: Payer: Self-pay | Admitting: Podiatry

## 2019-03-10 ENCOUNTER — Other Ambulatory Visit: Payer: Self-pay | Admitting: Podiatry

## 2019-03-10 VITALS — BP 134/79

## 2019-03-10 DIAGNOSIS — M779 Enthesopathy, unspecified: Secondary | ICD-10-CM

## 2019-03-10 DIAGNOSIS — M7752 Other enthesopathy of left foot: Secondary | ICD-10-CM | POA: Diagnosis not present

## 2019-03-10 DIAGNOSIS — M79672 Pain in left foot: Secondary | ICD-10-CM

## 2019-03-10 DIAGNOSIS — L84 Corns and callosities: Secondary | ICD-10-CM | POA: Diagnosis not present

## 2019-03-10 NOTE — Progress Notes (Signed)
Subjective:   Patient ID: [name], female   DOB: 63 y.o.   MRN: [id number]   HPI Patient presents stating I got inflammation on the bottom of my left foot with a lesion that is been painful and I am walking on the outside of my foot due to the fluid that is in there and its been going on around a year.  Patient does not smoke likes to be active   Review of Systems  All other systems reviewed and are negative.       Objective:  Physical Exam Vitals signs and nursing note reviewed.  Constitutional:      Appearance: She is well-developed.  Pulmonary:     Effort: Pulmonary effort is normal.  Musculoskeletal: Normal range of motion.  Skin:    General: Skin is warm.  Neurological:     Mental Status: She is alert.     Neurovascular status found to be intact muscle strength found to be adequate range of motion within normal limits.  Patient is found to have inflammation fluid around the first metatarsal head left that is painful when pressed with keratotic lesion that is painful and she is not walking properly on the medial side of her foot     Assessment:  Inflammatory capsulitis first MPJ left with lesion formation that is very painful     Plan:  H&P condition reviewed and recommended conservative treatment.  I did do sterile prep and injected the plantar capsule of the first MPJ 3 mg Dexasone Kenalog 5 mg Xylocaine did deep debridement of lesion we will see the results of this and decide if anything else is necessary  X-rays indicate no signs of fracture no signs of bone pathology appears to be soft tissue

## 2019-03-13 ENCOUNTER — Other Ambulatory Visit: Payer: Self-pay | Admitting: Family Medicine

## 2019-03-13 DIAGNOSIS — Z1231 Encounter for screening mammogram for malignant neoplasm of breast: Secondary | ICD-10-CM

## 2019-03-25 ENCOUNTER — Other Ambulatory Visit (HOSPITAL_COMMUNITY)
Admission: RE | Admit: 2019-03-25 | Discharge: 2019-03-25 | Disposition: A | Discharge status: Home / Self Care | Payer: BC Managed Care – PPO | Source: Ambulatory Visit | Attending: Family Medicine | Admitting: Family Medicine

## 2019-03-25 ENCOUNTER — Other Ambulatory Visit: Payer: Self-pay | Admitting: Family Medicine

## 2019-03-25 DIAGNOSIS — Z01411 Encounter for gynecological examination (general) (routine) with abnormal findings: Secondary | ICD-10-CM | POA: Insufficient documentation

## 2019-03-27 LAB — CYTOLOGY - PAP
Comment: NEGATIVE
Diagnosis: NEGATIVE
High risk HPV: NEGATIVE

## 2019-04-29 ENCOUNTER — Ambulatory Visit
Admission: RE | Admit: 2019-04-29 | Discharge: 2019-04-29 | Disposition: A | Discharge status: Home / Self Care | Payer: BC Managed Care – PPO | Source: Ambulatory Visit | Attending: Family Medicine | Admitting: Family Medicine

## 2019-04-29 ENCOUNTER — Other Ambulatory Visit: Payer: Self-pay

## 2019-04-29 DIAGNOSIS — Z1231 Encounter for screening mammogram for malignant neoplasm of breast: Secondary | ICD-10-CM

## 2019-06-16 ENCOUNTER — Ambulatory Visit: Payer: BC Managed Care – PPO | Admitting: Family Medicine

## 2019-06-17 ENCOUNTER — Other Ambulatory Visit: Payer: Self-pay

## 2019-06-17 ENCOUNTER — Ambulatory Visit (INDEPENDENT_AMBULATORY_CARE_PROVIDER_SITE_OTHER): Payer: BC Managed Care – PPO | Admitting: Sports Medicine

## 2019-06-17 VITALS — BP 132/72 | Ht 60.0 in | Wt 172.0 lb

## 2019-06-17 DIAGNOSIS — M25512 Pain in left shoulder: Secondary | ICD-10-CM | POA: Diagnosis not present

## 2019-06-17 MED ORDER — MELOXICAM 15 MG PO TABS: ORAL_TABLET | ORAL | 0 refills | Status: DC

## 2019-06-18 NOTE — Progress Notes (Signed)
   Subjective:    Patient ID: [name], female    DOB: [birth year], 64 y.o.   MRN: [id number]  HPI chief complaint: Left shoulder pain  64 year old right-hand-dominant female comes in today complaining of 6 months of left shoulder pain.  Pain is primarily diffuse around the shoulder but at times she will get pain around the left scapula and left side of her neck.  Her symptoms seem to be worse at night.  She describes it as aching in quality.  Pain will occasionally radiate to the left elbow but she denies radiating pain into the forearm or hand.  No numbness or tingling.  She is able to reproduce her pain by actively abducting her shoulder.  She has not noticed any weakness.  She does take 600 mg of ibuprofen occasionally which does seem to help.  She denies any recent trauma.  No prior shoulder surgeries.  No work-up to date.  Past medical history reviewed Medications reviewed Allergies reviewed    Review of Systems    As above Objective:   Physical Exam  Well-developed, well-nourished.  No acute distress.  Awake alert and oriented x3.  Vital signs reviewed  Left shoulder: Patient does have full range of motion but has a positive painful arc.  No tenderness over the acromioclavicular joint nor over the bicipital groove.  There is some diffuse tenderness around the scapula and left para spinal musculature.  Positive empty can, positive Hawkins.  She does have 4+/5 strength with resisted supraspinatus on the left which is likely due to pain.  5/5 strength with resisted external rotation and internal rotation.  Equivocal O'Brien's.  Neurovascularly intact distally.      Assessment & Plan:   Left shoulder pain likely secondary to rotator cuff impingement  Patient will start physical therapy and follow-up with me in 4 weeks for reevaluation.  We will start meloxicam 15 mg daily with food for 7 days then as needed.  Since the majority of her pain is at night I recommended that she  take her meloxicam with dinner.  If she continues to have pain at follow-up then I would consider imaging at that time.  I also discussed trying a subacromial cortisone injection if the meloxicam is ineffective.  Patient will call with questions or concerns prior to her follow-up visit.

## 2019-06-23 ENCOUNTER — Telehealth: Payer: Self-pay

## 2019-06-23 NOTE — Telephone Encounter (Signed)
Told pt I would pass her message along to Dr. Margaretha Sheffield when he returns to the office tomorrow. I will call her with next steps after that. She understands.

## 2019-07-01 ENCOUNTER — Encounter: Payer: Self-pay | Admitting: Physical Therapy

## 2019-07-01 ENCOUNTER — Ambulatory Visit: Payer: BC Managed Care – PPO | Attending: Sports Medicine | Admitting: Physical Therapy

## 2019-07-01 ENCOUNTER — Other Ambulatory Visit: Payer: Self-pay

## 2019-07-01 DIAGNOSIS — G8929 Other chronic pain: Secondary | ICD-10-CM | POA: Diagnosis present

## 2019-07-01 DIAGNOSIS — M25512 Pain in left shoulder: Secondary | ICD-10-CM | POA: Diagnosis present

## 2019-07-01 DIAGNOSIS — M6281 Muscle weakness (generalized): Secondary | ICD-10-CM | POA: Diagnosis present

## 2019-07-01 NOTE — Therapy (Signed)
[place], [place], [address] Phone: [phone]   Fax:  [phone]  Physical Therapy Evaluation  Patient Details  Name: [name] MRN: [id number] Date of Birth: [birth year] Referring Provider (PT): Dr Lilia Argue    Encounter Date: 07/01/2019  PT End of Session - 07/01/19 0906    Visit Number  1    Number of Visits  12    Date for PT Re-Evaluation  08/12/19    Authorization Type  BCBS    PT Start Time  0800    PT Stop Time  0847    PT Time Calculation (min)  47 min    Activity Tolerance  Patient tolerated treatment well    Behavior During Therapy  [facility] for tasks assessed/performed       Past Medical History:  Diagnosis Date  . Dysplasia of cervix   . Hyperlipidemia   . Hypertension   . Obesity   . Sarcoid     Past Surgical History:  Procedure Laterality Date  . APPENDECTOMY    . LEEP  05/04/1999  . UMBILICAL HERNIA REPAIR      There were no vitals filed for this visit.   Subjective Assessment - 07/01/19 0806    Subjective  Patient began having left shoulder pain in October 2020. She reports an incidious onset of pain. She started having pain that is worse with certain movements. She was recently put on Meloxicam. it helped but shehad to stop takeing it because of swelling in her hands and feet. When she stoped the pain retruns. She feels lie it is a cramping in her anterior shoulder with certain movements.    How long can you sit comfortably?  N/A    How long can you stand comfortably?  N/A    How long can you walk comfortably?  N/A    Diagnostic tests  Nothing    Patient Stated Goals  Have less pain and to lift her arm    Currently in Pain?  Yes    Pain Score  8     Pain Location  Shoulder    Pain Orientation  Left    Pain Descriptors / Indicators  Aching    Pain Type  Chronic pain    Pain Radiating Towards  into upper arm at times    Pain Onset  More than a month ago     Pain Frequency  Constant   gets worse at night   Aggravating Factors   lifting her arm over head and abductiong, sleeping at night    Pain Relieving Factors  sitting up at night, medication helped,         [facility] PT Assessment - 07/01/19 0001      Assessment   Medical Diagnosis  Left Shoulder Pain     Referring Provider (PT)  Dr Lilia Argue     Onset Date/Surgical Date  --   October of 2020    Hand Dominance  Right    Next MD Visit  07/18/2019    Prior Therapy  None       Precautions   Precautions  None      Restrictions   Weight Bearing Restrictions  No      Balance Screen   Has the patient fallen in the past 6 months  No    Has the patient had a decrease in activity level because of a fear of falling?   No  Is the patient reluctant to leave their home because of a fear of falling?   No      Home Environment   Additional Comments  nothing signifant       Prior Function   Level of Independence  Independent    Vocation  Full time employment    Vocation Requirements  Works at [employer] in several different departments       Cognition   Overall Cognitive Status  Within Functional Limits for tasks assessed    Attention  Focused    Focused Attention  Appears intact    Memory  Appears intact    Awareness  Appears intact    Problem Solving  Appears intact      Observation/Other Assessments   Focus on Therapeutic Outcomes (FOTO)   44% limitation 33% expected       Sensation   Light Touch  Appears Intact      Coordination   Gross Motor Movements are Fluid and Coordinated  Yes    Fine Motor Movements are Fluid and Coordinated  Yes      Posture/Postural Control   Posture Comments  rounded shoulders/ forward head       ROM / Strength   AROM / PROM / Strength  AROM;PROM;Strength      AROM   Overall AROM   Deficits    AROM Assessment Site  Shoulder    Right/Left Shoulder  Left    Left Shoulder Flexion  94 Degrees    Left Shoulder ABduction  90 Degrees     Left Shoulder Internal Rotation  --   full internal rotation    Left Shoulder External Rotation  --   pain reaching to the top of head      PROM   Overall PROM Comments  full passive ROM in all movements with minor pain at end range     PROM Assessment Site  Shoulder    Right/Left Shoulder  Right      Strength   Strength Assessment Site  Shoulder    Right/Left Shoulder  Right    Right Shoulder Flexion  3+/5    Right Shoulder Internal Rotation  5/5    Right Shoulder External Rotation  4/5   with pain      Special Tests   Other special tests  Speeds test (-) Hawkins (+)                 Objective measurements completed on examination: See above findings.      [facility] Adult PT Treatment/Exercise - 07/01/19 0001      Manual Therapy   Manual Therapy  Joint mobilization;Soft tissue mobilization    Joint Mobilization  grade II and III inferiod glide and AP glide     Soft tissue mobilization  to posterior shoulder              PT Education - 07/01/19 1353    Education Details  HEP and symptom mangement    Person(s) Educated  Patient    Methods  Explanation;Demonstration;Tactile cues;Verbal cues    Comprehension  Verbalized understanding;Returned demonstration;Verbal cues required;Tactile cues required       PT Short Term Goals - 07/01/19 0920      PT SHORT TERM GOAL #1   Title  Patient will increase gross left shoulder strength to 4+/5    Time  3    Period  Weeks    Status  New    Target Date  07/22/19      PT SHORT TERM GOAL #2   Title  Patient will improve left active shoulder flexion to 120 degrees without pain    Time  3    Period  Weeks    Status  New    Target Date  07/22/19      PT SHORT TERM GOAL #3   Title  Patient will be independent with basic HEP    Time  3    Period  Weeks    Status  New    Target Date  07/22/19        PT Long Term Goals - 07/01/19 0922      PT LONG TERM GOAL #1   Title  Patient will sleep through the night  without pain    Time  6    Period  Weeks    Status  New    Target Date  08/12/19      PT LONG TERM GOAL #2   Title  Patient will reach overhead to cabnet without pain    Time  6    Period  Weeks    Status  New    Target Date  08/12/19             Plan - 07/01/19 0907    Clinical Impression Statement  Patient is a 64 year old female with left shoulder pain. Signs and symptoms are consistent with impingement with likely tendinitis/ bursitis. She has limited acitve motion but full passive motion. She has weakness with right shoulder flexion and ER. She has to lift boxes at work. She would benefit from skilled therapy to improve shoulder strength and stability. She may also benefit from anti-inflamatory modalities such as ionto and ultrasound.    Personal Factors and Comorbidities  Comorbidity 1;Fitness    Comorbidities  sarcadosis, has never exercised    Examination-Activity Limitations  Lift;Reach Overhead;Carry    Examination-Participation Restrictions  Shop;Laundry;Community Activity    Stability/Clinical Decision Making  Stable/Uncomplicated    Clinical Decision Making  Low    Rehab Potential  Excellent    PT Frequency  2x / week    PT Duration  6 weeks    PT Treatment/Interventions  ADLs/Self Care Home Management;Cryotherapy;Electrical Stimulation;Iontophoresis 4mg /ml Dexamethasone;Moist Heat;Ultrasound;DME Instruction;Functional mobility training;Therapeutic activities;Therapeutic exercise;Patient/family education;Manual techniques;Taping;Dry needling;Passive range of motion    PT Next Visit Plan  consider supine exercises such as rythmic stabilization and supine ABC;s consider ionto, begin AP gilides and inferior glides to reduce impingement; review HEP.    PT Home Exercise Plan  scap retracrion, t-band IR ER, shoulder extension all with yellow    Consulted and Agree with Plan of Care  Patient       Patient will benefit from skilled therapeutic intervention in order to  improve the following deficits and impairments:  Increased muscle spasms, Decreased activity tolerance, Decreased range of motion, Impaired UE functional use  Visit Diagnosis: Chronic left shoulder pain  Muscle weakness (generalized)     Problem List Patient Active Problem List   Diagnosis Date Noted  . GERD (gastroesophageal reflux disease) 05/06/2015  . Allergic rhinitis 05/06/2015  . SARCOIDOSIS 08/01/2010  . COUGH 04/07/2010  . Nonspecific (abnormal) findings on radiological and other examination of body structure 04/07/2010  . COMPUTERIZED TOMOGRAPHY, CHEST, ABNORMAL 04/07/2010  . HYPERLIPIDEMIA 04/06/2010  . OBESITY 04/06/2010  . HYPERTENSION 04/06/2010  . DYSPLASIA OF CERVIX UNSPECIFIED 04/06/2010    13/07/2009  PT DPT  07/01/2019, 2:00 PM  [facility] [address] [place], [place], [address] Phone: [phone]   Fax:  [phone]  Name: [name] MRN: [id number] Date of Birth: 03/29/1956

## 2019-07-10 ENCOUNTER — Encounter: Payer: Self-pay | Admitting: Physical Therapy

## 2019-07-10 ENCOUNTER — Other Ambulatory Visit: Payer: Self-pay

## 2019-07-10 ENCOUNTER — Ambulatory Visit: Payer: BC Managed Care – PPO | Attending: Sports Medicine | Admitting: Physical Therapy

## 2019-07-10 DIAGNOSIS — G8929 Other chronic pain: Secondary | ICD-10-CM | POA: Diagnosis present

## 2019-07-10 DIAGNOSIS — M25512 Pain in left shoulder: Secondary | ICD-10-CM | POA: Insufficient documentation

## 2019-07-10 DIAGNOSIS — M6281 Muscle weakness (generalized): Secondary | ICD-10-CM | POA: Insufficient documentation

## 2019-07-10 NOTE — Therapy (Addendum)
[place], [place], [address] Phone: [phone]   Fax:  [phone]  Physical Therapy Treatment  Patient Details  Name: [name] MRN: [id number] Date of Birth: 1955-11-03 Referring Provider (PT): Dr Lilia Argue    Encounter Date: 07/10/2019  PT End of Session - 07/10/19 0917    Visit Number  1    Number of Visits  12    Date for PT Re-Evaluation  08/12/19    Authorization Type  BCBS    PT Start Time  0830    PT Stop Time  0917    PT Time Calculation (min)  47 min    Activity Tolerance  Patient tolerated treatment well    Behavior During Therapy  [facility] for tasks assessed/performed       Past Medical History:  Diagnosis Date  . Dysplasia of cervix   . Hyperlipidemia   . Hypertension   . Obesity   . Sarcoid     Past Surgical History:  Procedure Laterality Date  . APPENDECTOMY    . LEEP  05/04/1999  . UMBILICAL HERNIA REPAIR      There were no vitals filed for this visit.  Subjective Assessment - 07/10/19 0848    Subjective  Pt arriving to therapy reporting no pain at rest, but reporting pain at night that wakes her up and limits her ability to sleep.    How long can you sit comfortably?  N/A    How long can you stand comfortably?  N/A    How long can you walk comfortably?  N/A    Diagnostic tests  Nothing    Patient Stated Goals  Have less pain and to lift her arm    Currently in Pain?  No/denies    Pain Score  --   Pt reporting 8/10 pain at night and with certain movements   Pain Onset  More than a month ago    Aggravating Factors   lifting her arm over head and abducting, sleeping at night    Pain Relieving Factors  sitting upright and medication                       [facility] Adult PT Treatment/Exercise - 07/10/19 0001      Posture/Postural Control   Posture Comments  rounded shoulders/ forward head       Exercises   Exercises  Shoulder      Shoulder  Exercises: Supine   Protraction  AROM;Both;10 reps;Limitations    Protraction Limitations  pt reporting R shoulder fatigue, pt needed verbal cues for technique    External Rotation  AAROM;Left;15 reps    External Rotation Limitations  cane, holding 5 seconds each    Flexion  AAROM;10 reps    Flexion Limitations  cane, holding 5 second each    Other Supine Exercises  shoulder stabilization clockwise positions      Modalities   Modalities  Moist Heat      Moist Heat Therapy   Number Minutes Moist Heat  8 Minutes    Moist Heat Location  Shoulder      Manual Therapy   Manual Therapy  Joint mobilization;Soft tissue mobilization    Joint Mobilization  grade II and III, AP glides and inferior glides    Soft tissue mobilization  anterior and posterior shoulder, upper trap             PT Education - 07/10/19 0165  Education Details  added to pt's HEP, discussed possible IONTO use, pt reporting she wants to wait for now but is willing to try possible later appointment time to see if will help with pain releif at night.    Person(s) Educated  Patient    Methods  Explanation;Demonstration;Handout    Comprehension  Verbalized understanding;Returned demonstration       PT Short Term Goals - 07/10/19 1013      PT SHORT TERM GOAL #1   Title  Patient will increase gross left shoulder strength to 4+/5    Time  3    Period  Weeks    Status  On-going    Target Date  07/22/19      PT SHORT TERM GOAL #2   Title  Patient will improve left active shoulder flexion to 120 degrees without pain    Time  3    Period  Weeks    Status  New      PT SHORT TERM GOAL #3   Title  Patient will be independent with basic HEP    Time  3    Period  Weeks    Status  On-going        PT Long Term Goals - 07/01/19 1962      PT LONG TERM GOAL #1   Title  Patient will sleep through the night without pain    Time  6    Period  Weeks    Status  New    Target Date  08/12/19      PT LONG TERM  GOAL #2   Title  Patient will reach overhead to cabnet without pain    Time  6    Period  Weeks    Status  New    Target Date  08/12/19            Plan - 07/10/19 0914    Clinical Impression Statement  Pt arriving today reporting no pain at rest. Pt however reporting 8/10 pain at night and inability to sleep. Pt feels that her initial exercises are helping, pt was issued more stretches and exercises today. We discussed possible Ionto treatment and pt feels she wants to wait for now, but may consider in the future with a later appointment time to have more relief at night. Pt was edu in how to use pillows to position her L UE in a neutral position while sleeping on her right side. Pt reporting feeling less stiff following STM and joint mobs to L shoulder. Conitnue with skilled PT to progress towrad pt's PLOF.    Personal Factors and Comorbidities  Comorbidity 1;Fitness    Comorbidities  sarcadosis, has never exercised    Examination-Activity Limitations  Lift;Reach Overhead;Carry    Examination-Participation Restrictions  Shop;Laundry;Community Activity    Stability/Clinical Decision Making  Stable/Uncomplicated    Rehab Potential  Excellent    PT Frequency  2x / week    PT Duration  6 weeks    PT Treatment/Interventions  ADLs/Self Care Home Management;Cryotherapy;Electrical Stimulation;Iontophoresis 30m/ml Dexamethasone;Moist Heat;Ultrasound;DME Instruction;Functional mobility training;Therapeutic activities;Therapeutic exercise;Patient/family education;Manual techniques;Taping;Dry needling;Passive range of motion    PT Next Visit Plan  consider supine exercises such as rythmic stabilization and supine ABC;s consider ionto, begin AP gilides and inferior glides to reduce impingement; review HEP.    PT Home Exercise Plan  Access Code: [email or web address], scap retration, t-band IR/ER, shoulder extension with yellow band    Consulted and Agree with Plan of Care  Patient  Patient will benefit  from skilled therapeutic intervention in order to improve the following deficits and impairments:  Increased muscle spasms, Decreased activity tolerance, Decreased range of motion, Impaired UE functional use  Visit Diagnosis: Chronic left shoulder pain  Muscle weakness (generalized)  PHYSICAL THERAPY DISCHARGE SUMMARY  Visits from Start of Care: 2  Current functional level related to goals / functional outcomes: Unknown    Remaining deficits: Unknown     Education / Equipment: Unknown   Plan: Patient agrees to discharge.  Patient goals were not met. Patient is being discharged due to meeting the stated rehab goals.  ?????       Problem List Patient Active Problem List   Diagnosis Date Noted  . GERD (gastroesophageal reflux disease) 05/06/2015  . Allergic rhinitis 05/06/2015  . SARCOIDOSIS 08/01/2010  . COUGH 04/07/2010  . Nonspecific (abnormal) findings on radiological and other examination of body structure 04/07/2010  . COMPUTERIZED TOMOGRAPHY, CHEST, ABNORMAL 04/07/2010  . HYPERLIPIDEMIA 04/06/2010  . OBESITY 04/06/2010  . HYPERTENSION 04/06/2010  . DYSPLASIA OF CERVIX UNSPECIFIED 04/06/2010    Oretha Caprice, PT 07/10/2019, 10:17 AM   Carolyne Littles 09/03/2019   [facility] [place], [place], [address] Phone: [phone]   Fax:  [phone]  Name: [name] MRN: [id number] Date of Birth: 30-Jan-1956

## 2019-07-15 ENCOUNTER — Ambulatory Visit: Payer: BC Managed Care – PPO | Admitting: Physical Therapy

## 2019-07-15 ENCOUNTER — Ambulatory Visit: Payer: BC Managed Care – PPO | Admitting: Sports Medicine

## 2019-08-14 ENCOUNTER — Ambulatory Visit: Payer: BC Managed Care – PPO | Attending: Internal Medicine

## 2019-08-14 DIAGNOSIS — Z23 Encounter for immunization: Secondary | ICD-10-CM

## 2019-08-14 NOTE — Progress Notes (Signed)
   Covid-19 Vaccination Clinic  Name:  [name]    MRN: [id number] DOB: 06/07/1955  08/14/2019  [name] was observed post Covid-19 immunization for 15 minutes without incident. She was provided with Vaccine Information Sheet and instruction to access the V-Safe system.   [name] was instructed to call 911 with any severe reactions post vaccine: Marland Kitchen Difficulty breathing  . Swelling of face and throat  . A fast heartbeat  . A bad rash all over body  . Dizziness and weakness   Immunizations Administered    Name Date Dose VIS Date Route   Pfizer COVID-19 Vaccine 08/14/2019  3:21 PM 0.3 mL 05/16/2019 Intramuscular   Manufacturer: [employer], [employer]   Lot: [id number]   NDC: [id number]

## 2019-09-03 ENCOUNTER — Encounter: Payer: Self-pay | Admitting: Physical Therapy

## 2019-09-09 ENCOUNTER — Ambulatory Visit: Payer: BC Managed Care – PPO

## 2019-09-10 ENCOUNTER — Ambulatory Visit: Payer: BC Managed Care – PPO | Attending: Internal Medicine

## 2019-09-10 DIAGNOSIS — Z23 Encounter for immunization: Secondary | ICD-10-CM

## 2019-09-10 NOTE — Progress Notes (Signed)
   Covid-19 Vaccination Clinic  Name:  [name]    MRN: [id number] DOB: Jun 10, 1955  09/10/2019  [name] was observed post Covid-19 immunization for 15 minutes without incident. She was provided with Vaccine Information Sheet and instruction to access the V-Safe system.   [name] was instructed to call 911 with any severe reactions post vaccine: Marland Kitchen Difficulty breathing  . Swelling of face and throat  . A fast heartbeat  . A bad rash all over body  . Dizziness and weakness   Immunizations Administered    Name Date Dose VIS Date Route   Pfizer COVID-19 Vaccine 09/10/2019  2:08 PM 0.3 mL 05/16/2019 Intramuscular   Manufacturer: [employer], [employer]   Lot: [id number]   NDC: [address]-0

## 2020-04-16 ENCOUNTER — Other Ambulatory Visit: Payer: Self-pay | Admitting: Family Medicine

## 2020-04-16 DIAGNOSIS — Z1231 Encounter for screening mammogram for malignant neoplasm of breast: Secondary | ICD-10-CM

## 2020-05-12 ENCOUNTER — Telehealth: Payer: Self-pay | Admitting: Pulmonary Disease

## 2020-05-12 NOTE — Telephone Encounter (Signed)
05/12/2020  Attempted to contact the patient.  Left voicemail.  Patient previously established with Dr. Delton Coombes for sarcoidosis.  Apparently has upcoming appointment with Dr. Everardo All.  Left detailed voicemail stating that patient can proceed forward with the office visit.  The chest x-ray is deemed needed can proceed forward with completing that day as we have x-ray on site.  This will be attempted 1 Per triage policy.  Elisha Headland, FNP

## 2020-05-12 NOTE — Telephone Encounter (Signed)
Called and spoke with Patient.  Patient made aware of xray and lab being in office.  Also gave new office address. Understanding stated.  Patient LOV was 08/12/16, with Dr. Delton Coombes. Nothing further at this time.

## 2020-05-25 ENCOUNTER — Other Ambulatory Visit: Payer: Self-pay

## 2020-05-25 ENCOUNTER — Ambulatory Visit: Payer: BC Managed Care – PPO | Admitting: Pulmonary Disease

## 2020-05-25 ENCOUNTER — Encounter: Payer: Self-pay | Admitting: Pulmonary Disease

## 2020-05-25 VITALS — BP 110/60 | HR 97 | Temp 98.2°F | Ht 60.0 in | Wt 186.5 lb

## 2020-05-25 DIAGNOSIS — R059 Cough, unspecified: Secondary | ICD-10-CM | POA: Diagnosis not present

## 2020-05-25 DIAGNOSIS — D869 Sarcoidosis, unspecified: Secondary | ICD-10-CM

## 2020-05-25 MED ORDER — MOMETASONE FURO-FORMOTEROL FUM 100-5 MCG/ACT IN AERO: 2.0000 | INHALATION_SPRAY | Freq: Two times a day (BID) | RESPIRATORY_TRACT | 0 refills | Status: DC

## 2020-05-25 NOTE — Progress Notes (Signed)
Subjective:   PATIENT ID: [name] GENDER: female DOB: 07/23/1955, MRN: [id number]   HPI  Chief Complaint  Patient presents with   Consult    Chronic cough since November 11.  Dry cough, makes her throat hurt, coughs so much she ends up wetting herself.  Using delsym 12 hour, riccola cough drops.  She notices this when she takes a break at work and sits down at home.     Reason for Visit: New consult for cough   [name] is a 64 year old female with sarcoidosis, allergic rhinitis, GERD who presents for chronic cough.  She was previously seen by Dr. Delton Coombes with the last visit on 08/13/15 for management of her sarcoid. Note reviewed. She has had prior hx of chronic dry cough that has waxed and waned since 2011. She was previously on prednisone taper and was discontinued due to hyperglycemia/DM. Document reports owning birds in the home since 2001 though patient reports minimal exposure. She has been on nasal spray, omeprazole and Claritin in the past.  Since her last visit in 2017, she does report resolved cough. However reports chronic cough restarted since early November. Associated with incontinence sometimes. She has had cough, rhinorrhea, sinus congestion. No fevers or chills. She describes the cough as deep and intense. She is not clear what triggers the cough. She will take delsym 1-2 times daily as well as cough drops so she can get through work. She has developed chronic sore throat from coughing so hard. She reports shortness of breath with exertion but this is not new and she attribute this to her weight. Has some wheezing at night.  Social History: [place] exposure since 2001  I have personally reviewed patient's past medical/family/social history, allergies, current medications.  Past Medical History:  Diagnosis Date   Dysplasia of cervix    Hyperlipidemia    Hypertension    Obesity    Sarcoid      Family History  Problem Relation Age of Onset    Tuberculosis Other        grandfather   Breast cancer Neg Hx      Social History   Occupational History   Occupation: receiving Marine scientist: [id number]  Tobacco Use   Smoking status: Former Smoker    Packs/day: 0.50    Years: 20.00    Pack years: 10.00    Types: Cigarettes    Quit date: 06/06/1999    Years since quitting: 20.9   Smokeless tobacco: Former Engineer, water and Sexual Activity   Alcohol use: No   Drug use: No   Sexual activity: Not on file    No Known Allergies   Outpatient Medications Prior to Visit  Medication Sig Dispense Refill   calcium carbonate (OS-CAL) 600 MG TABS Take 600 mg by mouth daily with breakfast. 1200mg  once daily     Cholecalciferol (VITAMIN D) 50 MCG (2000 UT) CAPS 1 tablet     hydrochlorothiazide 25 MG tablet Take 25 mg by mouth daily.     Magnesium 250 MG TABS Take 250 mg by mouth daily.     Potassium 99 MG TABS Take 99 mg by mouth daily.     vitamin B-12 (CYANOCOBALAMIN) 1000 MCG tablet Take 1,000 mcg by mouth daily.     amLODipine (NORVASC) 5 MG tablet Take 5 mg by mouth daily. (Patient not taking: Reported on 05/25/2020)     fluticasone (FLONASE) 50 MCG/ACT nasal spray Place 2 sprays  into both nostrils daily. (Patient not taking: No sig reported) 16 g 2   loratadine (CLARITIN) 10 MG tablet Take 1 tablet (10 mg total) by mouth daily. (Patient not taking: No sig reported) 30 tablet 11   lovastatin (MEVACOR) 40 MG tablet Take 40 mg by mouth at bedtime. (Patient not taking: Reported on 05/25/2020)     meloxicam (MOBIC) 15 MG tablet Take 1 tablet daily with food for 7 days. Then take as needed. (Patient not taking: Reported on 05/25/2020) 40 tablet 0   omeprazole (PRILOSEC) 20 MG capsule Take 1 capsule (20 mg total) by mouth 2 (two) times daily before a meal. (Patient not taking: No sig reported) 60 capsule 2   predniSONE (DELTASONE) 10 MG tablet Take 2 tablets daily for 2 weeks then 1 tablet daily for 1 week then stop  (Patient not taking: No sig reported) 35 tablet 0   Vitamin D, Ergocalciferol, (DRISDOL) 50000 UNITS CAPS Take 20,000 Units by mouth daily. Every other week (Patient not taking: Reported on 05/25/2020)     No facility-administered medications prior to visit.    Review of Systems  Constitutional: Negative for chills, diaphoresis, fever, malaise/fatigue and weight loss.  HENT: Negative for congestion, ear pain and sore throat.   Respiratory: Positive for cough, shortness of breath and wheezing. Negative for hemoptysis and sputum production.   Cardiovascular: Negative for chest pain, palpitations and leg swelling.  Gastrointestinal: Negative for abdominal pain, heartburn and nausea.  Genitourinary: Negative for frequency.  Musculoskeletal: Negative for joint pain and myalgias.  Skin: Negative for itching and rash.  Neurological: Negative for dizziness, weakness and headaches.  Endo/Heme/Allergies: Does not bruise/bleed easily.  Psychiatric/Behavioral: Negative for depression. The patient is not nervous/anxious.      Objective:   Vitals:   05/25/20 1533  BP: 110/60  Pulse: 97  Temp: 98.2 F (36.8 C)  TempSrc: Oral  SpO2: 99%  Weight: 186 lb 8 oz (84.6 kg)  Height: 5' (1.524 m)   SpO2: 99 %  Physical Exam: General: Well-appearing, no acute distress HENT: [place], AT Eyes: EOMI, no scleral icterus Respiratory: Clear to auscultation bilaterally.  No crackles, wheezing or rales Cardiovascular: RRR, -M/R/G, no JVD Extremities:-Edema,-tenderness Neuro: AAO x4, CNII-XII grossly intact Skin: Intact, no rashes or bruising Psych: Normal mood, normal affect  Data Reviewed:  Imaging: CT Chest 08/12/19 - Calcified lymph nodes and chronic, mild subpleural and reticulonodular interstitial changes in the upper lobes suspected to be related to sarcoid  PFT: 08/13/15 FVC 2.5 (112%) FEV1 1.9 (105%) Ratio 77   Interpretation: Normal spirometry. Normal FEV1 and FVC.  Imaging, labs and test  noted above have been reviewed independently by me.    Assessment & Plan:   Discussion: 64 year old with sarcoid  Chronic cough - START Dulera 100-73mcg TWO puffs TWICE a day - Arrange for pulmonary function test   Pulmonary sarcoidosis - Dx in bronchoscopy via transbronchial bx in 05/2010 - No indication for prednisone therapy - Annual PFTs.  Order PFTs - Refer annual ophthalmology exam.   - Obtain CT Chest without contrast  Allergic Rhinitis - START allergy medication (loratadine or cetirizine) - START flonase 1 spray per nare per day  Health Maintenance Immunization History  Administered Date(s) Administered   Influenza Split 03/23/2017, 03/29/2018, 02/18/2019, 02/28/2020   Influenza,inj,Quad PF,6+ Mos 02/18/2019   PFIZER SARS-COV-2 Vaccination 08/14/2019, 09/10/2019, 04/05/2020   Td 12/09/2015   Tdap 02/28/2006   Zoster 01/14/2018, 05/18/2018   Zoster Recombinat (Shingrix) 05/18/2018   CT  Lung Screen - not indicated  Orders Placed This Encounter  Procedures   Ambulatory referral to Ophthalmology    Referral Priority:   Routine    Referral Type:   Consultation    Referral Reason:   Specialty Services Required    Requested Specialty:   Ophthalmology    Number of Visits Requested:   1   Pulmonary function test    Standing Status:   Future    Standing Expiration Date:   05/25/2021    Order Specific Question:   Where should this test be performed?    Answer:   [facility]    Order Specific Question:   Full PFT: includes the following: basic spirometry, spirometry pre & post bronchodilator, diffusion capacity (DLCO), lung volumes    Answer:   Full PFT    Order Specific Question:   MIP/MEP    Answer:   No    Order Specific Question:   6 minute walk    Answer:   No    Order Specific Question:   ABG    Answer:   No    Order Specific Question:   Diffusion capacity (DLCO)    Answer:   Yes    Order Specific Question:   Lung volumes    Answer:   Yes     Order Specific Question:   Methacholine challenge    Answer:   No   Meds ordered this encounter  Medications   mometasone-formoterol (DULERA) 100-5 MCG/ACT AERO    Sig: Inhale 2 puffs into the lungs in the morning and at bedtime.    Dispense:  1 each    Refill:  0    Return in about 1 month (around 06/25/2020).  I have spent a total time of 45 minutes on the day of the appointment reviewing prior documentation, coordinating care and discussing medical diagnosis and plan with the patient/family. Imaging, labs and tests included in this note have been reviewed and interpreted independently by me.  Ravon Mortellaro Mechele Collin, MD [facility] 05/25/2020 3:36 PM  Office Number [phone]

## 2020-05-25 NOTE — Patient Instructions (Addendum)
Chronic cough - START Dulera 100-30mcg TWO puffs TWICE a day - Arrange for pulmonary function test   Pulmonary sarcoidosis - Dx in bronchoscopy via transbronchial bx in 05/2010 - No indication for prednisone therapy - Annual PFTs.  Order PFTs - Refer annual ophthalmology exam.   - Obtain CT Chest without contrast  Allergic Rhinitis - START allergy medication (loratadine or cetirizine) - START flonase 1 spray per nare per day  Follow-up with me in 2 months

## 2020-05-27 ENCOUNTER — Ambulatory Visit
Admission: RE | Admit: 2020-05-27 | Discharge: 2020-05-27 | Disposition: A | Discharge status: Home / Self Care | Payer: BC Managed Care – PPO | Source: Ambulatory Visit | Attending: Family Medicine | Admitting: Family Medicine

## 2020-05-27 ENCOUNTER — Other Ambulatory Visit: Payer: Self-pay

## 2020-05-27 DIAGNOSIS — Z1231 Encounter for screening mammogram for malignant neoplasm of breast: Secondary | ICD-10-CM

## 2020-07-13 ENCOUNTER — Other Ambulatory Visit (HOSPITAL_COMMUNITY)
Admission: RE | Admit: 2020-07-13 | Discharge: 2020-07-13 | Disposition: A | Discharge status: Home / Self Care | Payer: BC Managed Care – PPO | Source: Ambulatory Visit | Attending: Pulmonary Disease | Admitting: Pulmonary Disease

## 2020-07-13 DIAGNOSIS — Z20822 Contact with and (suspected) exposure to covid-19: Secondary | ICD-10-CM | POA: Diagnosis not present

## 2020-07-13 DIAGNOSIS — Z01812 Encounter for preprocedural laboratory examination: Secondary | ICD-10-CM | POA: Diagnosis present

## 2020-07-13 LAB — SARS CORONAVIRUS 2 (TAT 6-24 HRS): SARS Coronavirus 2: NEGATIVE

## 2020-07-16 ENCOUNTER — Other Ambulatory Visit: Payer: Self-pay

## 2020-07-16 ENCOUNTER — Encounter: Payer: Self-pay | Admitting: Pulmonary Disease

## 2020-07-16 ENCOUNTER — Ambulatory Visit (INDEPENDENT_AMBULATORY_CARE_PROVIDER_SITE_OTHER): Payer: BC Managed Care – PPO | Admitting: Pulmonary Disease

## 2020-07-16 VITALS — BP 134/80 | HR 85 | Temp 97.3°F | Ht 60.25 in | Wt 183.0 lb

## 2020-07-16 DIAGNOSIS — D860 Sarcoidosis of lung: Secondary | ICD-10-CM | POA: Diagnosis not present

## 2020-07-16 DIAGNOSIS — R059 Cough, unspecified: Secondary | ICD-10-CM

## 2020-07-16 DIAGNOSIS — J984 Other disorders of lung: Secondary | ICD-10-CM

## 2020-07-16 DIAGNOSIS — R053 Chronic cough: Secondary | ICD-10-CM

## 2020-07-16 LAB — PULMONARY FUNCTION TEST
DL/VA % pred: 92 %
DL/VA: 4 ml/min/mmHg/L
DLCO cor % pred: 72 %
DLCO cor: 12.6 ml/min/mmHg
DLCO unc % pred: 72 %
DLCO unc: 12.6 ml/min/mmHg
FEF 25-75 Post: 1.92 L/sec
FEF 25-75 Pre: 1.8 L/sec
FEF2575-%Change-Post: 6 %
FEF2575-%Pred-Post: 111 %
FEF2575-%Pred-Pre: 104 %
FEV1-%Change-Post: 0 %
FEV1-%Pred-Post: 105 %
FEV1-%Pred-Pre: 104 %
FEV1-Post: 1.79 L
FEV1-Pre: 1.78 L
FEV1FVC-%Change-Post: 2 %
FEV1FVC-%Pred-Pre: 103 %
FEV6-%Change-Post: -2 %
FEV6-%Pred-Post: 101 %
FEV6-%Pred-Pre: 104 %
FEV6-Post: 2.12 L
FEV6-Pre: 2.17 L
FEV6FVC-%Pred-Post: 104 %
FEV6FVC-%Pred-Pre: 104 %
FVC-%Change-Post: -2 %
FVC-%Pred-Post: 98 %
FVC-%Pred-Pre: 100 %
FVC-Post: 2.13 L
FVC-Pre: 2.17 L
Post FEV1/FVC ratio: 84 %
Post FEV6/FVC ratio: 100 %
Pre FEV1/FVC ratio: 82 %
Pre FEV6/FVC Ratio: 100 %
RV % pred: 79 %
RV: 1.45 L
TLC % pred: 79 %
TLC: 3.54 L

## 2020-07-16 NOTE — Progress Notes (Signed)
Subjective:   PATIENT ID: [name] GENDER: female DOB: November 22, 1955, MRN: [id number]   HPI  Chief Complaint  Patient presents with  . Follow-up    Review of PFTs, no c/o    Reason for Visit: Follow-up   [name] is a 65 year old female with sarcoidosis, allergic rhinitis, GERD who presents for chronic cough.  Synopsis: Initially seen by Dr. Delton Coombes with the last visit on 08/13/15 for management of her sarcoid. She has had prior hx of chronic dry cough that has waxed and waned since 2011. She was previously on prednisone taper and was discontinued due to hyperglycemia/DM. Document reports owning birds in the home since 2001 though patient reports minimal exposure. Since her last visit in 2017, she does report resolved cough. However reports chronic cough restarted since early November 2021. Associated with incontinence sometimes. She has had cough, rhinorrhea, sinus congestion. No fevers or chills. She describes the cough as deep and intense. She is not clear what triggers the cough. She will take delsym 1-2 times daily as well as cough drops so she can get through work. She has developed chronic sore throat from coughing so hard. She reports shortness of breath with exertion but this is not new and she attribute this to her weight. Has some wheezing at night.  Since our last visit, she was started on [facility] and tolerating inhaler well. Cough has completely resolved while taking the inhaler. She stopped taking the [facility] after 30 days and has been off two weeks without recurrence of cough. Denies shortness of breath and wheezing. No issue completing PFTs this visit.  Social History: [name] exposure since 2001  I have personally reviewed patient's past medical/family/social history/allergies/current medications.  Past Medical History:  Diagnosis Date  . Dysplasia of cervix   . Hyperlipidemia   . Hypertension   . Obesity   . Sarcoid      Family History  Problem  Relation Age of Onset  . Tuberculosis Other        grandfather  . Breast cancer Neg Hx      Social History   Occupational History  . Occupation: receiving clerk    Employer: [id number]  Tobacco Use  . Smoking status: Former Smoker    Packs/day: 0.50    Years: 20.00    Pack years: 10.00    Types: Cigarettes    Quit date: 06/06/1999    Years since quitting: 21.1  . Smokeless tobacco: Former Engineer, water and Sexual Activity  . Alcohol use: No  . Drug use: No  . Sexual activity: Not on file    No Known Allergies   Outpatient Medications Prior to Visit  Medication Sig Dispense Refill  . calcium carbonate (OS-CAL) 600 MG TABS Take 600 mg by mouth daily with breakfast. 1200mg  once daily    . Cholecalciferol (VITAMIN D) 50 MCG (2000 UT) CAPS 1 tablet    . hydrochlorothiazide 25 MG tablet Take 25 mg by mouth daily.    . Magnesium 250 MG TABS Take 250 mg by mouth daily.    . Potassium 99 MG TABS Take 99 mg by mouth daily.    . vitamin B-12 (CYANOCOBALAMIN) 1000 MCG tablet Take 1,000 mcg by mouth daily.    . mometasone-formoterol (DULERA) 100-5 MCG/ACT AERO Inhale 2 puffs into the lungs in the morning and at bedtime. (Patient not taking: Reported on 07/16/2020) 1 each 0   No facility-administered medications prior to visit.    Review  of Systems  Constitutional: Negative for chills, diaphoresis, fever, malaise/fatigue and weight loss.  HENT: Negative for congestion.   Respiratory: Negative for cough, hemoptysis, sputum production, shortness of breath and wheezing.   Cardiovascular: Negative for chest pain, palpitations and leg swelling.     Objective:   Vitals:   07/16/20 1327  BP: 134/80  Pulse: 85  Temp: (!) 97.3 F (36.3 C)  TempSrc: Temporal  SpO2: 99%  Weight: 183 lb (83 kg)  Height: 5' 0.25" (1.53 m)   SpO2: 99 % O2 Device: None (Room air)  Physical Exam: General: Well-appearing, no acute distress HENT: [place], AT Eyes: EOMI, no scleral icterus Respiratory:  Clear to auscultation bilaterally.  No crackles, wheezing or rales Cardiovascular: RRR, -M/R/G, no JVD Extremities:-Edema,-tenderness Neuro: AAO x4, CNII-XII grossly intact Psych: Normal mood, normal affect  Data Reviewed:  Imaging: CT Chest 08/12/15 - Calcified lymph nodes and chronic, mild subpleural and reticulonodular interstitial changes in the upper lobes suspected to be related to sarcoid  CT Chest 07/23/20 - Progression of parenchymal disease with multifocal masses bilaterally, fibrosis. Unchanged calcified mediastinal and bilateral hilar lymph nodes. Mild centrilobular emphysema  PFT: 08/13/15 FVC 2.5 (112%) FEV1 1.9 (105%) Ratio 77   Interpretation: Normal spirometry. Normal FEV1 and FVC.  07/16/20 FVC 2.13 (98%) FEV1 1.78 (105%) Ratio 82  TLC 79% DLCO 72% Interpretation: Mild restrictive disease with reduced gas exchange. No obstructive defect however F-V demonstrate small airway defect. No significant bronchodilator response  Imaging, labs and test noted above have been reviewed independently by me.    Assessment & Plan:   Discussion: 65 year old with sarcoid who presents for follow-up for sarcoid and cough.  Chronic cough - improved - CONTINUE Dulera 100-12mcg TWO puffs AS NEEDED  Pulmonary sarcoidosis with restrictive defect +reduced gas exchange - Dx in bronchoscopy via transbronchial bx in 05/2010 - No indication for prednisone therapy - Annual PFTs.  Due 07/2021 - Annual ophthalmology exam.  Scheduled for March 2022 - ORDER CT Chest without contrast  Allergic Rhinitis - CONTINUE allergy medication (loratadine or cetirizine)  ADDENDUM: Discussed CT Chest findings with patient on 08/04/20. Progression of sarcoid related findings since 2017. Unclear if this is recent change. She is currently asymptomatic with stage III radiographic changes. We discussed observation with plan for repeating testing to determine further progression versus initiating steroid therapy now.  After discussion, we agreed that since she is asymptomatic we will plan for CT Chest without contrast and PFT in 6 months and re-evaluate at that time.  Health Maintenance Immunization History  Administered Date(s) Administered  . Influenza Split 03/23/2017, 03/29/2018, 02/18/2019, 02/28/2020  . Influenza,inj,Quad PF,6+ Mos 02/18/2019  . PFIZER(Purple Top)SARS-COV-2 Vaccination 08/14/2019, 09/10/2019, 04/05/2020  . Pneumococcal Conjugate-13 06/01/2020  . Td 12/09/2015  . Tdap 02/28/2006  . Zoster 01/14/2018, 05/18/2018  . Zoster Recombinat (Shingrix) 05/18/2018   CT Lung Screen - not indicated  Orders Placed This Encounter  Procedures  . CT Chest Wo Contrast    Sherry called TR [employer] COMM PPO    Standing Status:   Future    Number of Occurrences:   1    Standing Expiration Date:   07/16/2021    Order Specific Question:   Preferred imaging location?    Answer:   [facility]   Meds ordered this encounter  Medications  . mometasone-formoterol (DULERA) 100-5 MCG/ACT AERO    Sig: Inhale 2 puffs into the lungs 2 (two) times daily as needed for wheezing.  Dispense:  1 each    Refill:  6    Return in about 6 months (around 01/13/2021).   Markeshia Giebel Mechele Collin, MD [facility] 07/16/2020 1:31 PM  Office Number [phone]

## 2020-07-16 NOTE — Patient Instructions (Signed)
Chronic cough - improved - CONTINUE Dulera 100-25mcg TWO puffs AS NEEDED  Pulmonary sarcoidosis with restrictive defect +reduced gas exchange - Annual ophthalmology exam.  Scheduled for March 2022 - ORDER CT Chest without contrast. We will call you regarding results.  Allergic Rhinitis - CONTINUE allergy medication (loratadine or cetirizine)  Follow-up with me in 3 months

## 2020-07-16 NOTE — Progress Notes (Signed)
PFT done today. 

## 2020-07-23 ENCOUNTER — Ambulatory Visit (INDEPENDENT_AMBULATORY_CARE_PROVIDER_SITE_OTHER)
Admission: RE | Admit: 2020-07-23 | Discharge: 2020-07-23 | Disposition: A | Discharge status: Home / Self Care | Payer: BC Managed Care – PPO | Source: Ambulatory Visit | Attending: Pulmonary Disease | Admitting: Pulmonary Disease

## 2020-07-23 ENCOUNTER — Inpatient Hospital Stay: Admission: RE | Admit: 2020-07-23 | Payer: BC Managed Care – PPO | Source: Ambulatory Visit

## 2020-07-23 ENCOUNTER — Other Ambulatory Visit: Payer: Self-pay

## 2020-07-23 DIAGNOSIS — D860 Sarcoidosis of lung: Secondary | ICD-10-CM

## 2020-08-04 ENCOUNTER — Encounter: Payer: Self-pay | Admitting: Pulmonary Disease

## 2020-08-04 DIAGNOSIS — J984 Other disorders of lung: Secondary | ICD-10-CM

## 2020-08-04 HISTORY — DX: Other disorders of lung: J98.4

## 2020-08-04 MED ORDER — MOMETASONE FURO-FORMOTEROL FUM 100-5 MCG/ACT IN AERO: 2.0000 | INHALATION_SPRAY | Freq: Two times a day (BID) | RESPIRATORY_TRACT | 6 refills | Status: DC | PRN

## 2020-08-12 ENCOUNTER — Telehealth: Payer: Self-pay | Admitting: *Deleted

## 2020-08-12 DIAGNOSIS — D869 Sarcoidosis, unspecified: Secondary | ICD-10-CM

## 2020-08-12 NOTE — Telephone Encounter (Signed)
Called spoke with patient.  Let her know Dr. George Hugh recommendations Recall placed.  Order for PFT and CT placed.  Nothing further needed at this time.

## 2020-08-12 NOTE — Telephone Encounter (Signed)
-----   Message from Chi Mechele Collin, MD sent at 08/04/2020 11:04 AM EST ----- Please cancel 10/15/20 appointment and re-schedule for 6 month follow-up with me. She will need CT Chest without contrast and PFTs arranged prior to visit.

## 2020-10-05 ENCOUNTER — Telehealth: Payer: Self-pay | Admitting: Pulmonary Disease

## 2020-10-05 DIAGNOSIS — D869 Sarcoidosis, unspecified: Secondary | ICD-10-CM

## 2020-10-05 NOTE — Telephone Encounter (Signed)
Called spoke with patient.  Does not want to see the eye doctor she was referred to wants to see someone in the [facility] . New referral order placed.  PCC's patient would like a call as to which doctor the referral is going to.   Nothing further needed from triage at this time.

## 2020-10-15 ENCOUNTER — Ambulatory Visit: Payer: BC Managed Care – PPO | Admitting: Pulmonary Disease

## 2021-01-04 ENCOUNTER — Telehealth: Payer: Self-pay | Admitting: Pulmonary Disease

## 2021-01-05 NOTE — Telephone Encounter (Signed)
Pt has upcoming appts on 8/15 with pt having PFT  prior to OV with Dr. Everardo All.  Called and spoke with pt who had questions if she could get her CT performed at [facility] instead of [address] as her insurance does not cover her to have her CT performed there. Stated to pt that she could get it done at [facility] and pt verbalized understanding.. nothing further needed.

## 2021-01-12 ENCOUNTER — Inpatient Hospital Stay: Admission: RE | Admit: 2021-01-12 | Payer: BC Managed Care – PPO | Source: Ambulatory Visit

## 2021-01-14 ENCOUNTER — Ambulatory Visit (INDEPENDENT_AMBULATORY_CARE_PROVIDER_SITE_OTHER)
Admission: RE | Admit: 2021-01-14 | Discharge: 2021-01-14 | Disposition: A | Discharge status: Home / Self Care | Payer: BC Managed Care – PPO | Source: Ambulatory Visit | Attending: Pulmonary Disease | Admitting: Pulmonary Disease

## 2021-01-14 ENCOUNTER — Other Ambulatory Visit: Payer: Self-pay

## 2021-01-14 DIAGNOSIS — D869 Sarcoidosis, unspecified: Secondary | ICD-10-CM

## 2021-01-17 ENCOUNTER — Ambulatory Visit: Payer: BC Managed Care – PPO | Admitting: Pulmonary Disease

## 2021-01-17 ENCOUNTER — Other Ambulatory Visit: Payer: Self-pay

## 2021-01-17 ENCOUNTER — Ambulatory Visit (INDEPENDENT_AMBULATORY_CARE_PROVIDER_SITE_OTHER): Payer: BC Managed Care – PPO | Admitting: Pulmonary Disease

## 2021-01-17 VITALS — BP 118/80 | HR 84 | Temp 98.0°F | Ht 60.0 in | Wt 179.6 lb

## 2021-01-17 DIAGNOSIS — J984 Other disorders of lung: Secondary | ICD-10-CM | POA: Diagnosis not present

## 2021-01-17 DIAGNOSIS — D869 Sarcoidosis, unspecified: Secondary | ICD-10-CM | POA: Diagnosis not present

## 2021-01-17 DIAGNOSIS — Z8616 Personal history of COVID-19: Secondary | ICD-10-CM | POA: Diagnosis not present

## 2021-01-17 DIAGNOSIS — D860 Sarcoidosis of lung: Secondary | ICD-10-CM | POA: Diagnosis not present

## 2021-01-17 LAB — PULMONARY FUNCTION TEST
DL/VA % pred: 91 %
DL/VA: 3.95 ml/min/mmHg/L
DLCO cor % pred: 72 %
DLCO cor: 12.54 ml/min/mmHg
DLCO unc % pred: 72 %
DLCO unc: 12.54 ml/min/mmHg
FEF 25-75 Post: 1.93 L/sec
FEF 25-75 Pre: 2 L/sec
FEF2575-%Change-Post: -3 %
FEF2575-%Pred-Post: 114 %
FEF2575-%Pred-Pre: 118 %
FEV1-%Change-Post: 0 %
FEV1-%Pred-Post: 104 %
FEV1-%Pred-Pre: 104 %
FEV1-Post: 1.75 L
FEV1-Pre: 1.75 L
FEV1FVC-%Change-Post: 0 %
FEV1FVC-%Pred-Pre: 106 %
FEV6-%Change-Post: 0 %
FEV6-%Pred-Post: 102 %
FEV6-%Pred-Pre: 101 %
FEV6-Post: 2.1 L
FEV6-Pre: 2.09 L
FEV6FVC-%Pred-Post: 104 %
FEV6FVC-%Pred-Pre: 104 %
FVC-%Change-Post: 0 %
FVC-%Pred-Post: 98 %
FVC-%Pred-Pre: 97 %
FVC-Post: 2.1 L
FVC-Pre: 2.09 L
Post FEV1/FVC ratio: 83 %
Post FEV6/FVC ratio: 100 %
Pre FEV1/FVC ratio: 84 %
Pre FEV6/FVC Ratio: 100 %
RV % pred: 61 %
RV: 1.13 L
TLC % pred: 72 %
TLC: 3.25 L

## 2021-01-17 LAB — CBC WITH DIFFERENTIAL/PLATELET
Basophils Absolute: 0 10*3/uL (ref 0.0–0.1)
Basophils Relative: 0.3 % (ref 0.0–3.0)
Eosinophils Absolute: 0.1 10*3/uL (ref 0.0–0.7)
Eosinophils Relative: 0.7 % (ref 0.0–5.0)
HCT: 40.1 % (ref 36.0–46.0)
Hemoglobin: 12.8 g/dL (ref 12.0–15.0)
Lymphocytes Relative: 34.9 % (ref 12.0–46.0)
Lymphs Abs: 3 10*3/uL (ref 0.7–4.0)
MCHC: 31.8 g/dL (ref 30.0–36.0)
MCV: 85.2 fl (ref 78.0–100.0)
Monocytes Absolute: 0.5 10*3/uL (ref 0.1–1.0)
Monocytes Relative: 5.7 % (ref 3.0–12.0)
Neutro Abs: 4.9 10*3/uL (ref 1.4–7.7)
Neutrophils Relative %: 58.4 % (ref 43.0–77.0)
Platelets: 254 10*3/uL (ref 150.0–400.0)
RBC: 4.71 Mil/uL (ref 3.87–5.11)
RDW: 14.5 % (ref 11.5–15.5)
WBC: 8.5 10*3/uL (ref 4.0–10.5)

## 2021-01-17 LAB — COMPREHENSIVE METABOLIC PANEL
ALT: 4 U/L (ref 0–35)
AST: 15 U/L (ref 0–37)
Albumin: 4.1 g/dL (ref 3.5–5.2)
Alkaline Phosphatase: 70 U/L (ref 39–117)
BUN: 13 mg/dL (ref 6–23)
CO2: 28 mEq/L (ref 19–32)
Calcium: 10.4 mg/dL (ref 8.4–10.5)
Chloride: 101 mEq/L (ref 96–112)
Creatinine, Ser: 0.87 mg/dL (ref 0.40–1.20)
GFR: 70.9 mL/min (ref >60.00)
Glucose, Bld: 86 mg/dL (ref 70–99)
Potassium: 3.8 mEq/L (ref 3.5–5.1)
Sodium: 139 mEq/L (ref 135–145)
Total Bilirubin: 0.5 mg/dL (ref 0.2–1.2)
Total Protein: 7.8 g/dL (ref 6.0–8.3)

## 2021-01-17 NOTE — Patient Instructions (Addendum)
Chronic cough - improved - CONTINUE Dulera 100-34mcg TWO puffs AS NEEDED  Pulmonary sarcoidosis with restrictive defect +reduced gas exchange - Annual PFTs.  Due 01/2022.  - Repeat CT Chest in 01/2022. Order at next visit - Recommend annual ophthalmology exam - Obtain baseline EKG - Obtain CBC w/diff and CMET today  Follow-up with me in 6 months

## 2021-01-17 NOTE — Progress Notes (Signed)
PFT done today. 

## 2021-01-17 NOTE — Progress Notes (Signed)
Subjective:   PATIENT ID: [name] GENDER: female DOB: 17-Mar-1956, MRN: [id number]   HPI  Chief Complaint  Patient presents with   Follow-up    Restarted Dulera last week r/t cough, PFT review, Covid 12/2020   Reason for Visit: Follow-up   [name] is a 65 year old female with pulmonary sarcoidosis, allergic rhinitis, GERD who presents for chronic cough.  Synopsis:  Initially seen by Dr. Lamonte Sakai with the last visit on 08/13/15 for management of her sarcoid. She has had prior hx of chronic dry cough that has waxed and waned since 2011. She was previously on prednisone taper and was discontinued due to hyperglycemia/DM. Document reports owning birds in the home since 2001 though patient reports minimal exposure. Since her last visit in 2017, she does report resolved cough. However reports chronic cough restarted since early November 2021.  2021 - Treated for chronic cough with Dulera. Cough resolved and Dulera was discontinued 2022 - COVID-19 infection in July, no hospitalization. Progression of sarcoid on CT chest compared to 2017 however on repeat CT stable  Since our last visit, she was diagnosed with COVID-19 with five days of symptoms with headaches, chills, cough and shortness of breath with exertion. Lost her sense of taste/smell for a day. Has fully recovered since then. She did restart her Dulera for cough. She is awaiting her CT and PFT results today  Social History: [place] exposure since 2001  Past Medical History:  Diagnosis Date   Dysplasia of cervix    Hyperlipidemia    Hypertension    Obesity    Sarcoid      Family History  Problem Relation Age of Onset   Tuberculosis Other        grandfather   Breast cancer Neg Hx      Social History   Occupational History   Occupation: receiving Armed forces operational officer: [id number]  Tobacco Use   Smoking status: Former    Packs/day: 0.50    Years: 20.00    Pack years: 10.00    Types: Cigarettes    Quit  date: 06/06/1999    Years since quitting: 21.6   Smokeless tobacco: Former  Substance and Sexual Activity   Alcohol use: No   Drug use: No   Sexual activity: Not on file    No Known Allergies   Outpatient Medications Prior to Visit  Medication Sig Dispense Refill   calcium carbonate (OS-CAL) 600 MG TABS Take 600 mg by mouth daily with breakfast. 1270m once daily     Cholecalciferol (VITAMIN D) 50 MCG (2000 UT) CAPS 1 tablet     hydrochlorothiazide 25 MG tablet Take 25 mg by mouth daily.     Magnesium 250 MG TABS Take 250 mg by mouth daily.     mometasone-formoterol (DULERA) 100-5 MCG/ACT AERO Inhale 2 puffs into the lungs in the morning and at bedtime. (Patient not taking: Reported on 07/16/2020) 1 each 0   mometasone-formoterol (DULERA) 100-5 MCG/ACT AERO Inhale 2 puffs into the lungs 2 (two) times daily as needed for wheezing. 1 each 6   Potassium 99 MG TABS Take 99 mg by mouth daily.     vitamin B-12 (CYANOCOBALAMIN) 1000 MCG tablet Take 1,000 mcg by mouth daily.     No facility-administered medications prior to visit.    Review of Systems  Constitutional:  Negative for chills, diaphoresis, fever, malaise/fatigue and weight loss.  HENT:  Negative for congestion.   Respiratory:  Negative for  cough, hemoptysis, sputum production, shortness of breath and wheezing.   Cardiovascular:  Negative for chest pain, palpitations and leg swelling.    Objective:   Vitals:   01/17/21 1102  BP: 118/80  Pulse: 84  Temp: 98 F (36.7 C)  TempSrc: Oral  SpO2: 99%  Weight: 179 lb 9.6 oz (81.5 kg)  Height: 5' (1.524 m)      Physical Exam: General: Well-appearing, no acute distress HENT: [place], AT Eyes: EOMI, no scleral icterus Respiratory: Clear to auscultation bilaterally.  No crackles, wheezing or rales Cardiovascular: RRR, -M/R/G, no JVD Extremities:-Edema,-tenderness Neuro: AAO x4, CNII-XII grossly intact Psych: Normal mood, normal affect  Data Reviewed:  Imaging: CT Chest  08/12/15 - Calcified lymph nodes and chronic, mild subpleural and reticulonodular interstitial changes in the upper lobes suspected to be related to sarcoid  CT Chest 07/23/20 - Progression of parenchymal disease with multifocal masses bilaterally, fibrosis. Unchanged calcified mediastinal and bilateral hilar lymph nodes. Mild centrilobular emphysema CT Chest 01/14/21 - Unchanged parenchymal masses and scarring. Unchanged calcified mediastinal and hilar lymph nodes, calcified. These findings are consistent with sarcoidosis. Background mild emphysema  PFT: 08/13/15 FVC 2.5 (112%) FEV1 1.9 (105%) Ratio 77   Interpretation: Normal spirometry. Normal FEV1 and FVC.  07/16/20 FVC 2.13 (98%) FEV1 1.78 (105%) Ratio 82  TLC 79% DLCO 72% Interpretation: Mild restrictive disease with reduced gas exchange. No obstructive defect however F-V demonstrate small airway defect. No significant bronchodilator response  01/17/21 FVC 2.10 (98%) FEV1 1.75 (104%) Ratio 84  TLC 72% DLCO 72% Interpretation: Mild restrictive defect with mildly reduced gas exchange. Overall stable compared to prior PFT in Feb 2022.      Assessment & Plan:   Discussion: 65 year old female with pulmonary sarcoid who presents for follow-up Stable spirometry and gas changed. 5% reduction in TLC. Stable CT Chest findings consistent with sarcoid. No progression and this likely represents stable lung findings. Counseled patient on clinical course and management of sarcoid which mainly involves surveillance with PFTs, CT, labs and eye exams. Patient and daughter (via phone) asked questions which were answered to their satisfaction.  Chronic cough - improved - CONTINUE Dulera 100-68mg TWO puffs AS NEEDED  Pulmonary sarcoidosis with restrictive defect +reduced gas exchange - Dx in bronchoscopy via transbronchial bx in 05/2010 - No indication for prednisone therapy - Annual PFTs.  Due 01/2022.  - Repeat CT Chest in 01/2022. Order at next visit -  Annual ophthalmology exam.  UTD. - Obtain baseline EKG - Obtain CBC w/diff and CMET today  Recent COVID-19 infection - resolved - No additional intervention  Allergic Rhinitis - CONTINUE allergy medication (loratadine or cetirizine)   Health Maintenance Immunization History  Administered Date(s) Administered   Influenza Split 03/23/2017, 03/29/2018, 02/18/2019, 02/28/2020   Influenza,inj,Quad PF,6+ Mos 02/18/2019   PFIZER(Purple Top)SARS-COV-2 Vaccination 08/14/2019, 09/10/2019, 04/05/2020   Pneumococcal Conjugate-13 06/01/2020   Td 12/09/2015   Tdap 02/28/2006   Zoster Recombinat (Shingrix) 05/18/2018   Zoster, Live 01/14/2018, 05/18/2018   CT Lung Screen - not indicated  Orders Placed This Encounter  Procedures   CBC w/Diff    Standing Status:   Future    Number of Occurrences:   1    Standing Expiration Date:   01/17/2022   Comp Met (CMET)    Standing Status:   Future    Number of Occurrences:   1    Standing Expiration Date:   01/17/2022   EKG 12-Lead   No orders of the defined types were  placed in this encounter.   Return in about 6 months (around 07/20/2021).  I have spent a total time of 36-minutes on the day of the appointment reviewing prior documentation, coordinating care and discussing medical diagnosis and plan with the patient/family. Past medical history, allergies, medications were reviewed. Pertinent imaging, labs and tests included in this note have been reviewed and interpreted independently by me.   Redbird Smith, MD [facility] 01/17/2021 10:01 AM  Office Number [phone]

## 2021-01-20 ENCOUNTER — Encounter: Payer: Self-pay | Admitting: Pulmonary Disease

## 2021-04-13 ENCOUNTER — Other Ambulatory Visit: Payer: Self-pay | Admitting: Family Medicine

## 2021-04-13 DIAGNOSIS — Z1231 Encounter for screening mammogram for malignant neoplasm of breast: Secondary | ICD-10-CM

## 2021-05-06 DIAGNOSIS — H903 Sensorineural hearing loss, bilateral: Secondary | ICD-10-CM | POA: Diagnosis not present

## 2021-05-31 ENCOUNTER — Ambulatory Visit
Admission: RE | Admit: 2021-05-31 | Discharge: 2021-05-31 | Disposition: A | Discharge status: Home / Self Care | Payer: BC Managed Care – PPO | Source: Ambulatory Visit | Attending: Family Medicine | Admitting: Family Medicine

## 2021-05-31 DIAGNOSIS — Z1231 Encounter for screening mammogram for malignant neoplasm of breast: Secondary | ICD-10-CM | POA: Diagnosis not present

## 2021-06-17 DIAGNOSIS — I10 Essential (primary) hypertension: Secondary | ICD-10-CM | POA: Diagnosis not present

## 2021-06-17 DIAGNOSIS — Z0000 Encounter for general adult medical examination without abnormal findings: Secondary | ICD-10-CM | POA: Diagnosis not present

## 2021-06-17 DIAGNOSIS — Z13220 Encounter for screening for lipoid disorders: Secondary | ICD-10-CM | POA: Diagnosis not present

## 2021-06-17 DIAGNOSIS — Z1159 Encounter for screening for other viral diseases: Secondary | ICD-10-CM | POA: Diagnosis not present

## 2021-06-17 DIAGNOSIS — Z23 Encounter for immunization: Secondary | ICD-10-CM | POA: Diagnosis not present

## 2021-06-17 DIAGNOSIS — E538 Deficiency of other specified B group vitamins: Secondary | ICD-10-CM | POA: Diagnosis not present

## 2021-06-17 DIAGNOSIS — M545 Low back pain, unspecified: Secondary | ICD-10-CM | POA: Diagnosis not present

## 2021-06-17 DIAGNOSIS — E559 Vitamin D deficiency, unspecified: Secondary | ICD-10-CM | POA: Diagnosis not present

## 2021-11-21 DIAGNOSIS — J029 Acute pharyngitis, unspecified: Secondary | ICD-10-CM | POA: Diagnosis not present

## 2021-11-21 DIAGNOSIS — Z20822 Contact with and (suspected) exposure to covid-19: Secondary | ICD-10-CM | POA: Diagnosis not present

## 2021-11-21 DIAGNOSIS — J069 Acute upper respiratory infection, unspecified: Secondary | ICD-10-CM | POA: Diagnosis not present

## 2021-12-23 ENCOUNTER — Telehealth: Payer: Self-pay | Admitting: Pulmonary Disease

## 2021-12-23 DIAGNOSIS — E7800 Pure hypercholesterolemia, unspecified: Secondary | ICD-10-CM | POA: Diagnosis not present

## 2021-12-23 DIAGNOSIS — D860 Sarcoidosis of lung: Secondary | ICD-10-CM | POA: Diagnosis not present

## 2021-12-23 DIAGNOSIS — D869 Sarcoidosis, unspecified: Secondary | ICD-10-CM

## 2021-12-23 DIAGNOSIS — J984 Other disorders of lung: Secondary | ICD-10-CM

## 2021-12-23 DIAGNOSIS — R251 Tremor, unspecified: Secondary | ICD-10-CM | POA: Diagnosis not present

## 2021-12-23 DIAGNOSIS — I10 Essential (primary) hypertension: Secondary | ICD-10-CM | POA: Diagnosis not present

## 2021-12-23 NOTE — Telephone Encounter (Signed)
There are not any open orders to schedule

## 2021-12-23 NOTE — Telephone Encounter (Signed)
Order is in for CT WO Contrast for August 2023.   Thank you

## 2021-12-27 NOTE — Telephone Encounter (Signed)
I have pulled this order and will schedule

## 2022-01-03 ENCOUNTER — Ambulatory Visit (HOSPITAL_COMMUNITY): Payer: BC Managed Care – PPO

## 2022-02-02 ENCOUNTER — Ambulatory Visit
Admission: RE | Admit: 2022-02-02 | Discharge: 2022-02-02 | Disposition: A | Discharge status: Home / Self Care | Payer: BC Managed Care – PPO | Source: Ambulatory Visit | Attending: Pulmonary Disease | Admitting: Pulmonary Disease

## 2022-02-02 DIAGNOSIS — I700 Atherosclerosis of aorta: Secondary | ICD-10-CM | POA: Diagnosis not present

## 2022-02-02 DIAGNOSIS — J984 Other disorders of lung: Secondary | ICD-10-CM

## 2022-02-02 DIAGNOSIS — D869 Sarcoidosis, unspecified: Secondary | ICD-10-CM

## 2022-02-23 ENCOUNTER — Other Ambulatory Visit: Payer: Self-pay | Admitting: *Deleted

## 2022-02-23 DIAGNOSIS — D860 Sarcoidosis of lung: Secondary | ICD-10-CM

## 2022-02-24 ENCOUNTER — Ambulatory Visit: Payer: BC Managed Care – PPO | Admitting: Pulmonary Disease

## 2022-02-24 ENCOUNTER — Ambulatory Visit (INDEPENDENT_AMBULATORY_CARE_PROVIDER_SITE_OTHER): Payer: BC Managed Care – PPO | Admitting: Pulmonary Disease

## 2022-02-24 ENCOUNTER — Encounter: Payer: Self-pay | Admitting: Pulmonary Disease

## 2022-02-24 VITALS — BP 118/60 | HR 84 | Ht 60.0 in | Wt 181.0 lb

## 2022-02-24 DIAGNOSIS — J984 Other disorders of lung: Secondary | ICD-10-CM

## 2022-02-24 DIAGNOSIS — D860 Sarcoidosis of lung: Secondary | ICD-10-CM | POA: Diagnosis not present

## 2022-02-24 LAB — CBC WITH DIFFERENTIAL/PLATELET
Basophils Absolute: 0 10*3/uL (ref 0.0–0.1)
Basophils Relative: 0.5 % (ref 0.0–3.0)
Eosinophils Absolute: 0.1 10*3/uL (ref 0.0–0.7)
Eosinophils Relative: 0.8 % (ref 0.0–5.0)
HCT: 42.5 % (ref 36.0–46.0)
Hemoglobin: 13.8 g/dL (ref 12.0–15.0)
Lymphocytes Relative: 33.6 % (ref 12.0–46.0)
Lymphs Abs: 3 10*3/uL (ref 0.7–4.0)
MCHC: 32.3 g/dL (ref 30.0–36.0)
MCV: 86.4 fl (ref 78.0–100.0)
Monocytes Absolute: 0.4 10*3/uL (ref 0.1–1.0)
Monocytes Relative: 4.8 % (ref 3.0–12.0)
Neutro Abs: 5.4 10*3/uL (ref 1.4–7.7)
Neutrophils Relative %: 60.3 % (ref 43.0–77.0)
Platelets: 253 10*3/uL (ref 150.0–400.0)
RBC: 4.93 Mil/uL (ref 3.87–5.11)
RDW: 14.6 % (ref 11.5–15.5)
WBC: 8.9 10*3/uL (ref 4.0–10.5)

## 2022-02-24 LAB — COMPREHENSIVE METABOLIC PANEL
ALT: 9 U/L (ref 0–35)
AST: 21 U/L (ref 0–37)
Albumin: 4.2 g/dL (ref 3.5–5.2)
Alkaline Phosphatase: 78 U/L (ref 39–117)
BUN: 11 mg/dL (ref 6–23)
CO2: 28 mEq/L (ref 19–32)
Calcium: 10.2 mg/dL (ref 8.4–10.5)
Chloride: 100 mEq/L (ref 96–112)
Creatinine, Ser: 0.9 mg/dL (ref 0.40–1.20)
GFR: 67.55 mL/min (ref >60.00)
Glucose, Bld: 95 mg/dL (ref 70–99)
Potassium: 3.2 mEq/L — ABNORMAL LOW (ref 3.5–5.1)
Sodium: 139 mEq/L (ref 135–145)
Total Bilirubin: 0.4 mg/dL (ref 0.2–1.2)
Total Protein: 8.2 g/dL (ref 6.0–8.3)

## 2022-02-24 LAB — PULMONARY FUNCTION TEST
DL/VA % pred: 98 %
DL/VA: 4.24 ml/min/mmHg/L
DLCO cor % pred: 73 %
DLCO cor: 12.71 ml/min/mmHg
DLCO unc % pred: 73 %
DLCO unc: 12.71 ml/min/mmHg
FEF 25-75 Post: 1.95 L/sec
FEF 25-75 Pre: 1.88 L/sec
FEF2575-%Change-Post: 3 %
FEF2575-%Pred-Post: 100 %
FEF2575-%Pred-Pre: 96 %
FEV1-%Change-Post: 0 %
FEV1-%Pred-Post: 80 %
FEV1-%Pred-Pre: 80 %
FEV1-Post: 1.68 L
FEV1-Pre: 1.68 L
FEV1FVC-%Change-Post: 1 %
FEV1FVC-%Pred-Pre: 107 %
FEV6-%Change-Post: -1 %
FEV6-%Pred-Post: 75 %
FEV6-%Pred-Pre: 77 %
FEV6-Post: 1.98 L
FEV6-Pre: 2.01 L
FEV6FVC-%Pred-Post: 104 %
FEV6FVC-%Pred-Pre: 104 %
FVC-%Change-Post: -1 %
FVC-%Pred-Post: 72 %
FVC-%Pred-Pre: 74 %
FVC-Post: 1.98 L
FVC-Pre: 2.01 L
Post FEV1/FVC ratio: 85 %
Post FEV6/FVC ratio: 100 %
Pre FEV1/FVC ratio: 83 %
Pre FEV6/FVC Ratio: 100 %
RV % pred: 63 %
RV: 1.18 L
TLC % pred: 70 %
TLC: 3.14 L

## 2022-02-24 NOTE — Patient Instructions (Addendum)
-   ORDER PFT for 02/2023. Reviewed PFTs today and overall stable. - Stable CT Chest with unchanged opacities. - Annual ophthalmology exam.  Last 02/2021. Scheduled for next month - ORDER CBC with diff and CMET  Follow-up with me in 1 year

## 2022-02-24 NOTE — Patient Instructions (Signed)
Full PFT performed today. °

## 2022-02-24 NOTE — Progress Notes (Unsigned)
Subjective:   PATIENT ID: [name] GENDER: female DOB: 07/26/1955, MRN: [id number]   HPI  Chief Complaint  Patient presents with   Follow-up    PFT results   Reason for Visit: Follow-up   [name] is a 66 year old female with pulmonary sarcoidosis, allergic rhinitis, GERD who presents for chronic cough.  Synopsis:  Initially seen by Dr. Lamonte Sakai with the last visit on 08/13/15 for management of her sarcoid. She has had prior hx of chronic dry cough that has waxed and waned since 2011. She was previously on prednisone taper and was discontinued due to hyperglycemia/DM. Document reports owning birds in the home since 2001 though patient reports minimal exposure. Since her last visit in 2017, she does report resolved cough. However reports chronic cough restarted since early November 2021.  2021 - Treated for chronic cough with Dulera. Cough resolved and Dulera was discontinued 2022 - COVID-19 infection in July, no hospitalization. Progression of sarcoid on CT chest compared to 2017 however on repeat CT stable  02/24/22 Since our last visit she denies shortness of breath, cough or wheezing. Has normal energy. Not active at baseline but planning to. Denies any significant illness. She is no longer on Dulera after self-discontinuing two months ago.  Social History: [place] exposure since 2001  Past Medical History:  Diagnosis Date   Dysplasia of cervix    Hyperlipidemia    Hypertension    Obesity    Sarcoid      Family History  Problem Relation Age of Onset   Tuberculosis Other        grandfather   Breast cancer Neg Hx      Social History   Occupational History   Occupation: receiving Armed forces operational officer: [id number]  Tobacco Use   Smoking status: Former    Packs/day: 0.50    Years: 20.00    Total pack years: 10.00    Types: Cigarettes    Quit date: 06/06/1999    Years since quitting: 22.7   Smokeless tobacco: Former  Substance and Sexual Activity    Alcohol use: No   Drug use: No   Sexual activity: Not on file    No Known Allergies   Outpatient Medications Prior to Visit  Medication Sig Dispense Refill   calcium carbonate (OS-CAL) 600 MG TABS Take 600 mg by mouth daily with breakfast. 1238m once daily     Cholecalciferol (VITAMIN D) 50 MCG (2000 UT) CAPS 1 tablet     hydrochlorothiazide 25 MG tablet Take 25 mg by mouth daily.     Magnesium 250 MG TABS Take 250 mg by mouth daily.     Potassium 99 MG TABS Take 99 mg by mouth daily.     vitamin B-12 (CYANOCOBALAMIN) 1000 MCG tablet Take 1,000 mcg by mouth daily.     mometasone-formoterol (DULERA) 100-5 MCG/ACT AERO Inhale 2 puffs into the lungs 2 (two) times daily as needed for wheezing. 1 each 6   tolterodine (DETROL LA) 4 MG 24 hr capsule Take 4 mg by mouth daily. (Patient not taking: Reported on 02/24/2022)     mometasone-formoterol (DULERA) 100-5 MCG/ACT AERO Inhale 2 puffs into the lungs in the morning and at bedtime. 1 each 0   No facility-administered medications prior to visit.    Review of Systems  Constitutional:  Negative for chills, diaphoresis, fever, malaise/fatigue and weight loss.  HENT:  Negative for congestion.   Respiratory:  Negative for cough, hemoptysis, sputum production,  shortness of breath and wheezing.   Cardiovascular:  Negative for chest pain, palpitations and leg swelling.     Objective:   Vitals:   02/24/22 1329  BP: 118/60  Pulse: 84  SpO2: 100%  Weight: 82.1 kg  Height: 5' (1.524 m)   SpO2: 100 % O2 Device: None (Room air)  Physical Exam: General: Well-appearing, no acute distress HENT: [place], AT Eyes: EOMI, no scleral icterus Respiratory: Clear to auscultation bilaterally.  No crackles, wheezing or rales Cardiovascular: RRR, -M/R/G, no JVD Extremities:-Edema,-tenderness Neuro: AAO x4, CNII-XII grossly intact Psych: Normal mood, normal affect  Data Reviewed:  Imaging: CT Chest 08/12/15 - Calcified lymph nodes and chronic, mild  subpleural and reticulonodular interstitial changes in the upper lobes suspected to be related to sarcoid  CT Chest 07/23/20 - Progression of parenchymal disease with multifocal masses bilaterally, fibrosis. Unchanged calcified mediastinal and bilateral hilar lymph nodes. Mild centrilobular emphysema CT Chest 01/14/21 - Unchanged parenchymal masses and scarring. Unchanged calcified mediastinal and hilar lymph nodes, calcified. These findings are consistent with sarcoidosis. Background mild emphysema CT Chest 02/02/22 - Stable opacities in the upper lobes and lower lobes bilaterally.  PFT: 08/13/15 FVC 2.5 (112%) FEV1 1.9 (105%) Ratio 77   Interpretation: Normal spirometry. Normal FEV1 and FVC.  07/16/20 FVC 2.13 (98%) FEV1 1.78 (105%) Ratio 82  TLC 79% DLCO 72% Interpretation: Mild restrictive disease with reduced gas exchange. No obstructive defect however F-V demonstrate small airway defect. No significant bronchodilator response  01/17/21 FVC 2.10 (98%) FEV1 1.75 (104%) Ratio 84  TLC 72% DLCO 72% Interpretation: Mild restrictive defect with mildly reduced gas exchange. Overall stable compared to prior PFT in Feb 2022.  02/24/22 FVC 1.98 (72%) FEV1 1.68 (80%) Ratio 83  TLC 70% DLCO 73% Interpretation: Mild restrictive defect with mildly reduced gas exchange. Reduced FVC and FEV1 however remains mild restrictive severity  CBC    Component Value Date/Time   WBC 8.9 02/24/2022 1404   RBC 4.93 02/24/2022 1404   HGB 13.8 02/24/2022 1404   HCT 42.5 02/24/2022 1404   PLT 253.0 02/24/2022 1404   MCV 86.4 02/24/2022 1404   MCHC 32.3 02/24/2022 1404   RDW 14.6 02/24/2022 1404   LYMPHSABS 3.0 02/24/2022 1404   MONOABS 0.4 02/24/2022 1404   EOSABS 0.1 02/24/2022 1404   BASOSABS 0.0 02/24/2022 1404      Latest Ref Rng & Units 02/24/2022    2:04 PM 01/17/2021   11:35 AM  CMP  Glucose 70 - 99 mg/dL 95  86   BUN 6 - 23 mg/dL 11  13   Creatinine 0.40 - 1.20 mg/dL 0.90  0.87   Sodium 135 - 145  mEq/L 139  139   Potassium 3.5 - 5.1 mEq/L 3.2  3.8   Chloride 96 - 112 mEq/L 100  101   CO2 19 - 32 mEq/L 28  28   Calcium 8.4 - 10.5 mg/dL 10.2  10.4   Total Protein 6.0 - 8.3 g/dL 8.2  7.8   Total Bilirubin 0.2 - 1.2 mg/dL 0.4  0.5   Alkaline Phos 39 - 117 U/L 78  70   AST 0 - 37 U/L 21  15   ALT 0 - 35 U/L 9  4       Assessment & Plan:   Discussion: 66 year old female with pulmonary sarcoid who presents for annual follow-up. Reviewed history, imaging and PFTs. Stable CT chest findings consistent with sarcoid.  Spirometry reviewed with stable with mild restrictive defect and mildly  reduced DLCO however it is noted that FEV1 and FVC reduced by 20%. This could be related to sarcoid but she did have prior COVID infection which could have contributed to these changes as well. Not clinically significant as she is asymptomatic.  No indication for immunosuppressants at this time.  We discussed the clinical course of sarcoid and management including serial PFTs, labs, eye exam, and EKG and chest imaging if indicated. If symptoms suggest sarcoid flare in the future, we would manage with steroids +/- biologics.  Addressed questions and concerns to patient's satisfaction.  Pulmonary sarcoidosis with restrictive defect +reduced gas exchange - Dx in bronchoscopy via transbronchial bx in 05/2010 - No indication for prednisone therapy - Annual PFTs.  Reviewed and overall stable - Stable CT Chest with unchanged opacities - Annual ophthalmology exam.  Last 02/2021. Scheduled for next month - EKG with incomplete RBBB. Consider echo in future if symptomatic - ORDER CBC with diff and CMET. ADDENDUM: Overall normal labs  Allergic Rhinitis - CONTINUE allergy medication (loratadine or cetirizine)   Health Maintenance Immunization History  Administered Date(s) Administered   Influenza Split 03/23/2017, 03/29/2018, 02/18/2019, 02/28/2020   Influenza,inj,Quad PF,6+ Mos 02/18/2019   PFIZER(Purple  Top)SARS-COV-2 Vaccination 08/14/2019, 09/10/2019, 04/05/2020   Pneumococcal Conjugate-13 06/01/2020   Td 12/09/2015   Tdap 02/28/2006   Zoster Recombinat (Shingrix) 05/18/2018   Zoster, Live 01/14/2018, 05/18/2018   CT Lung Screen - not indicated  Orders Placed This Encounter  Procedures   CBC w/Diff    Standing Status:   Future    Number of Occurrences:   1    Standing Expiration Date:   02/24/2023   Comp Met (CMET)    Standing Status:   Future    Number of Occurrences:   1    Standing Expiration Date:   02/24/2023   No orders of the defined types were placed in this encounter.   Return in about 1 year (around 02/25/2023).  I have spent a total time of 45-minutes on the day of the appointment including chart review, data review, collecting history, coordinating care and discussing medical diagnosis and plan with the patient/family. Past medical history, allergies, medications were reviewed. Pertinent imaging, labs and tests included in this note have been reviewed and interpreted independently by me.  Crystal Lake, MD [facility] 03/02/2022 8:38 PM  Office Number [phone]

## 2022-02-24 NOTE — Progress Notes (Signed)
Full PFT performed today. °

## 2022-04-18 ENCOUNTER — Other Ambulatory Visit: Payer: Self-pay | Admitting: Family Medicine

## 2022-04-18 DIAGNOSIS — Z1231 Encounter for screening mammogram for malignant neoplasm of breast: Secondary | ICD-10-CM

## 2022-05-05 DIAGNOSIS — N3946 Mixed incontinence: Secondary | ICD-10-CM | POA: Diagnosis not present

## 2022-06-02 ENCOUNTER — Other Ambulatory Visit: Payer: Self-pay | Admitting: Family Medicine

## 2022-06-02 ENCOUNTER — Ambulatory Visit
Admission: RE | Admit: 2022-06-02 | Discharge: 2022-06-02 | Disposition: A | Discharge status: Home / Self Care | Payer: BC Managed Care – PPO | Source: Ambulatory Visit | Attending: Family Medicine | Admitting: Family Medicine

## 2022-06-02 DIAGNOSIS — Z1231 Encounter for screening mammogram for malignant neoplasm of breast: Secondary | ICD-10-CM

## 2022-07-07 DIAGNOSIS — U071 COVID-19: Secondary | ICD-10-CM | POA: Diagnosis not present

## 2022-07-07 DIAGNOSIS — J069 Acute upper respiratory infection, unspecified: Secondary | ICD-10-CM | POA: Diagnosis not present

## 2022-07-07 DIAGNOSIS — D860 Sarcoidosis of lung: Secondary | ICD-10-CM | POA: Diagnosis not present

## 2022-07-10 ENCOUNTER — Telehealth: Payer: Self-pay | Admitting: Pulmonary Disease

## 2022-07-10 NOTE — Telephone Encounter (Signed)
Patient is calling requested a refill on Dulera. Dr. Loanne Drilling is it okay to fill this prescription again?

## 2022-07-11 NOTE — Telephone Encounter (Signed)
OK to re-order Dulera 100 TWO puffs TWICE a day with two refills.  Please contact patient as she will need follow-up with me before May if she wants to continue this as we need to evaluate for asthma vs sarcoid.

## 2022-07-12 ENCOUNTER — Telehealth (HOSPITAL_BASED_OUTPATIENT_CLINIC_OR_DEPARTMENT_OTHER): Payer: Self-pay | Admitting: Pulmonary Disease

## 2022-07-12 MED ORDER — MOMETASONE FURO-FORMOTEROL FUM 100-5 MCG/ACT IN AERO: 2.0000 | INHALATION_SPRAY | Freq: Two times a day (BID) | RESPIRATORY_TRACT | 2 refills | Status: DC

## 2022-07-12 NOTE — Telephone Encounter (Signed)
Message left to schedule appt and medication sent to pharmacy

## 2022-07-12 NOTE — Telephone Encounter (Signed)
Pt called and stated that since she had COVID 2 wks ago PCP reccomended to start [facility] again. Pt needs refill and does not want to come into the office if not needed because she is worried it will mess with insurance since she is not due until 9/24. Please advise and call patient back.

## 2022-07-14 NOTE — Telephone Encounter (Signed)
Please review  dulera sent to pharmacy with 2 refills per 07/10/22 provider note. Pt called to schedule appt states will keep sept appt.

## 2022-07-14 NOTE — Telephone Encounter (Signed)
Please schedule patient with me for end of May.   She understands this is a follow-up appointment since she had to restart her [facility] after COVID infection. We will reassess if her symptoms are persistent and if we can step down in therapy

## 2022-08-04 ENCOUNTER — Ambulatory Visit (HOSPITAL_BASED_OUTPATIENT_CLINIC_OR_DEPARTMENT_OTHER): Payer: BC Managed Care – PPO | Admitting: Pulmonary Disease

## 2022-08-11 DIAGNOSIS — Z0000 Encounter for general adult medical examination without abnormal findings: Secondary | ICD-10-CM | POA: Diagnosis not present

## 2022-08-11 DIAGNOSIS — M7918 Myalgia, other site: Secondary | ICD-10-CM | POA: Diagnosis not present

## 2022-08-11 DIAGNOSIS — E559 Vitamin D deficiency, unspecified: Secondary | ICD-10-CM | POA: Diagnosis not present

## 2022-08-11 DIAGNOSIS — E538 Deficiency of other specified B group vitamins: Secondary | ICD-10-CM | POA: Diagnosis not present

## 2022-08-11 DIAGNOSIS — I10 Essential (primary) hypertension: Secondary | ICD-10-CM | POA: Diagnosis not present

## 2022-08-11 DIAGNOSIS — E782 Mixed hyperlipidemia: Secondary | ICD-10-CM | POA: Diagnosis not present

## 2022-09-26 DIAGNOSIS — R251 Tremor, unspecified: Secondary | ICD-10-CM | POA: Diagnosis not present

## 2022-10-27 ENCOUNTER — Encounter (HOSPITAL_BASED_OUTPATIENT_CLINIC_OR_DEPARTMENT_OTHER): Payer: Self-pay | Admitting: Pulmonary Disease

## 2022-10-27 ENCOUNTER — Ambulatory Visit (HOSPITAL_BASED_OUTPATIENT_CLINIC_OR_DEPARTMENT_OTHER): Payer: BC Managed Care – PPO | Admitting: Pulmonary Disease

## 2022-10-27 VITALS — BP 112/64 | HR 89 | Temp 98.1°F | Ht 59.0 in | Wt 184.8 lb

## 2022-10-27 DIAGNOSIS — D869 Sarcoidosis, unspecified: Secondary | ICD-10-CM

## 2022-10-27 NOTE — Progress Notes (Signed)
Subjective:   PATIENT ID: [name] GENDER: female DOB: Apr 30, 1956, MRN: [id number]   HPI  Chief Complaint  Patient presents with   Follow-up    Follow up. Patient has no complaints.    Reason for Visit: Follow-up   [name] is a 67 year old female with pulmonary sarcoidosis, allergic rhinitis, GERD who presents for follow-up for inhaler management.  Synopsis:  Initially seen by Dr. Delton Coombes with the last visit on 08/13/15 for management of her sarcoid. She has had prior hx of chronic dry cough that has waxed and waned since 2011. She was previously on prednisone taper and was discontinued due to hyperglycemia/DM. Document reports owning birds in the home since 2001 though patient reports minimal exposure. Since her last visit in 2017, she does report resolved cough. However reports chronic cough restarted since early November 2021.  2021 - Treated for chronic cough with Dulera. Cough resolved and Dulera was discontinued 2022 - COVID-19 infection in July, no hospitalization. Progression of sarcoid on CT chest compared to 2017 however on repeat CT stable 2023 - Intermittently on Dulera. Self discontinued in July and asymptomatic  10/27/22 She was recently restarted on [facility] after a covid infection in 07/2022. She self-discontinued this in April and overall has had no issues. But is starting to fill short of breath in the last few weeks but believes this is related to recent weight gain >10lbs. She is planning to cut down on sugary snacks and drinks. She plans to re-trial the [facility] again in case this is still a breathing issue. Denies cough or wheezing. No limitations in activity  Social History: [name] exposure since 2001  Past Medical History:  Diagnosis Date   Dysplasia of cervix    Hyperlipidemia    Hypertension    Obesity    Sarcoid      Family History  Problem Relation Age of Onset   Tuberculosis Other        grandfather   Breast cancer Neg Hx       Social History   Occupational History   Occupation: receiving Marine scientist: [email or web address]  Tobacco Use   Smoking status: Former    Packs/day: 0.50    Years: 20.00    Additional pack years: 0.00    Total pack years: 10.00    Types: Cigarettes    Quit date: 06/06/1999    Years since quitting: 23.4   Smokeless tobacco: Former  Substance and Sexual Activity   Alcohol use: No   Drug use: No   Sexual activity: Not on file    No Known Allergies   Outpatient Medications Prior to Visit  Medication Sig Dispense Refill   calcium carbonate (OS-CAL) 600 MG TABS Take 600 mg by mouth daily with breakfast. 1200mg  once daily     Cholecalciferol (VITAMIN D) 50 MCG (2000 UT) CAPS 1 tablet     hydrochlorothiazide 25 MG tablet Take 25 mg by mouth daily.     Magnesium 250 MG TABS Take 400 mg by mouth daily.     mometasone-formoterol (DULERA) 100-5 MCG/ACT AERO Inhale 2 puffs into the lungs 2 (two) times daily. 1 each 2   Potassium 99 MG TABS Take 99 mg by mouth daily.     tizanidine (ZANAFLEX) 2 MG capsule Take 2 mg by mouth 3 (three) times daily as needed.     tolterodine (DETROL LA) 4 MG 24 hr capsule Take 4 mg by mouth daily.  vitamin B-12 (CYANOCOBALAMIN) 1000 MCG tablet Take 1,000 mcg by mouth daily.     No facility-administered medications prior to visit.    Review of Systems  Constitutional:  Negative for chills, diaphoresis, fever, malaise/fatigue and weight loss.  HENT:  Negative for congestion.   Respiratory:  Positive for shortness of breath. Negative for cough, hemoptysis, sputum production and wheezing.   Cardiovascular:  Negative for chest pain, palpitations and leg swelling.     Objective:   Vitals:   10/27/22 0905  BP: 112/64  Pulse: 89  Temp: 98.1 F (36.7 C)  TempSrc: Oral  SpO2: 99%  Weight: 184 lb 12.8 oz (83.8 kg)  Height: 4\' 11"  (1.499 m)   SpO2: 99 % O2 Device: None (Room air)  Physical Exam: General: Well-appearing, no acute distress HENT: [place],  AT Eyes: EOMI, no scleral icterus Respiratory: Clear to auscultation bilaterally.  No crackles, wheezing or rales Cardiovascular: RRR, -M/R/G, no JVD Extremities:-Edema,-tenderness Neuro: AAO x4, CNII-XII grossly intact Psych: Normal mood, normal affect   Data Reviewed:  Imaging: CT Chest 08/12/15 - Calcified lymph nodes and chronic, mild subpleural and reticulonodular interstitial changes in the upper lobes suspected to be related to sarcoid  CT Chest 07/23/20 - Progression of parenchymal disease with multifocal masses bilaterally, fibrosis. Unchanged calcified mediastinal and bilateral hilar lymph nodes. Mild centrilobular emphysema CT Chest 01/14/21 - Unchanged parenchymal masses and scarring. Unchanged calcified mediastinal and hilar lymph nodes, calcified. These findings are consistent with sarcoidosis. Background mild emphysema CT Chest 02/02/22 - Stable opacities in the upper lobes and lower lobes bilaterally.  PFT: 08/13/15 FVC 2.5 (112%) FEV1 1.9 (105%) Ratio 77   Interpretation: Normal spirometry. Normal FEV1 and FVC.  07/16/20 FVC 2.13 (98%) FEV1 1.78 (105%) Ratio 82  TLC 79% DLCO 72% Interpretation: Mild restrictive disease with reduced gas exchange. No obstructive defect however F-V demonstrate small airway defect. No significant bronchodilator response  01/17/21 FVC 2.10 (98%) FEV1 1.75 (104%) Ratio 84  TLC 72% DLCO 72% Interpretation: Mild restrictive defect with mildly reduced gas exchange. Overall stable compared to prior PFT in Feb 2022.  02/24/22 FVC 1.98 (72%) FEV1 1.68 (80%) Ratio 83  TLC 70% DLCO 73% Interpretation: Mild restrictive defect with mildly reduced gas exchange. Reduced FVC and FEV1 however remains mild restrictive severity     Latest Ref Rng & Units 02/24/2022    2:04 PM 01/17/2021   11:35 AM  CBC  WBC 4.0 - 10.5 K/uL 8.9  8.5   Hemoglobin 12.0 - 15.0 g/dL 54.0  98.1   Hematocrit 36.0 - 46.0 % 42.5  40.1   Platelets 150.0 - 400.0 K/uL 253.0  254.0         Latest Ref Rng & Units 02/24/2022    2:04 PM 01/17/2021   11:35 AM  CMP  Glucose 70 - 99 mg/dL 95  86   BUN 6 - 23 mg/dL 11  13   Creatinine 1.91 - 1.20 mg/dL 4.78  2.95   Sodium 621 - 145 mEq/L 139  139   Potassium 3.5 - 5.1 mEq/L 3.2  3.8   Chloride 96 - 112 mEq/L 100  101   CO2 19 - 32 mEq/L 28  28   Calcium 8.4 - 10.5 mg/dL 30.8  65.7   Total Protein 6.0 - 8.3 g/dL 8.2  7.8   Total Bilirubin 0.2 - 1.2 mg/dL 0.4  0.5   Alkaline Phos 39 - 117 U/L 78  70   AST 0 - 37 U/L 21  15   ALT 0 - 35 U/L 9  4       Assessment & Plan:   Discussion: 67 year old female with pulmonary sarcoid who presents for follow-up for recent covid. Previously need inhalers during her illness but now improving. Intermittently on and off Dulera with recurrent symptoms. Unclear if related to post-infectious cause vs need for maintenance meds  We discussed the clinical course of sarcoid and management including serial PFTs, labs, eye exam, and EKG and chest imaging if indicated. If symptoms suggest sarcoid flare in the future, we would manage with steroids +/- biologics.  Pulmonary sarcoidosis with restrictive defect +reduced gas exchange - Dx in bronchoscopy via transbronchial bx in 05/2010 - No indication for prednisone therapy - Annual PFTs.  Reviewed and overall stable. ORDER for 02/2023 - Stable CT Chest with unchanged opacities. ORDER repeat for 02/2023 - Annual ophthalmology exam.  Last 02/2022 - EKG with incomplete RBBB. Consider echo in future if symptomatic - At next visit: CBC with diff (complete blood count with differential) and CMET (complete metabolic panel)  Allergic Rhinitis - CONTINUE allergy medication (loratadine or cetirizine)   Health Maintenance Immunization History  Administered Date(s) Administered   Influenza Split 03/23/2017, 03/29/2018, 02/18/2019, 02/28/2020   Influenza,inj,Quad PF,6+ Mos 02/18/2019   PFIZER(Purple Top)SARS-COV-2 Vaccination 08/14/2019, 09/10/2019,  04/05/2020   Pneumococcal Conjugate-13 06/01/2020   Td 12/09/2015   Tdap 02/28/2006   Zoster Recombinat (Shingrix) 05/18/2018   Zoster, Live 01/14/2018, 05/18/2018   CT Lung Screen - not indicated  Orders Placed This Encounter  Procedures   CT Chest Wo Contrast    Standing Status:   Future    Standing Expiration Date:   10/27/2023    Scheduling Instructions:     Schedule in September with appt with Dr. Everardo All after to discuss results    Order Specific Question:   Preferred imaging location?    Answer:   [facility]   Pulmonary function test    Standing Status:   Future    Standing Expiration Date:   10/27/2023    Scheduling Instructions:     Schedule for 02/2023    Order Specific Question:   Where should this test be performed?    Answer:   [facility]    Order Specific Question:   Full PFT: includes the following: basic spirometry, spirometry pre & post bronchodilator, diffusion capacity (DLCO), lung volumes    Answer:   Full PFT   No orders of the defined types were placed in this encounter.   Return for September , after PFT, after CT scan.  I have spent a total time of 32-minutes on the day of the appointment including chart review, data review, collecting history, coordinating care and discussing medical diagnosis and plan with the patient/family. Past medical history, allergies, medications were reviewed. Pertinent imaging, labs and tests included in this note have been reviewed and interpreted independently by me.  Libi Corso Mechele Collin, MD [facility] 10/27/2022 2:57 PM

## 2022-10-27 NOTE — Patient Instructions (Addendum)
Pulmonary sarcoidosis with restrictive defect +reduced gas exchange - ORDER for 02/2023 - ORDER repeat for 02/2023 - At next visit: CBC with diff (complete blood count with differential) and CMET (complete metabolic panel)

## 2022-11-02 ENCOUNTER — Ambulatory Visit: Payer: BC Managed Care – PPO | Admitting: Neurology

## 2022-11-02 ENCOUNTER — Encounter: Payer: Self-pay | Admitting: Neurology

## 2022-11-02 VITALS — BP 127/81 | HR 77 | Ht 60.0 in | Wt 185.0 lb

## 2022-11-02 DIAGNOSIS — G250 Essential tremor: Secondary | ICD-10-CM | POA: Diagnosis not present

## 2022-11-02 NOTE — Patient Instructions (Signed)
You have a rather mild tremor in your head, no hand tremor. You may have a mild form of what we call essential tremor.  I do not see any signs or symptoms of parkinson's like disease or what we call parkinsonism.  For your tremor, I would not recommend any new medications at this time.   We can see you in one year for a check up.   Please remember, that any kind of tremor may be exacerbated by anxiety, anger, nervousness, excitement, dehydration, sleep deprivation, thyroid dysfunction, by caffeine, and low blood sugar values or blood sugar fluctuations. Some medications can exacerbate tremors, this includes certain asthma or COPD medications and certain antidepressants. With your next blood with your PCP, please make sure they check your thyroid function.

## 2022-11-02 NOTE — Progress Notes (Signed)
Subjective:    Patient ID: [name] is a 67 y.o. female.  HPI    Huston Foley, MD, PhD [facility] [address], [address] [address] [place], [place] [address]  Dear Dr. Estevan Oaks,   I saw your patient, [name], upon your kind request in my neurologic clinic today for initial consultation of her head tremor.  The patient is unaccompanied today.  As you know, [name] is a 67 year old female with an underlying medical history of vitamin D deficiency, vitamin B12 deficiency, sarcoidosis, hyperlipidemia, meralgia paresthetica, anxiety, and obesity, who reports an approximately 1+ year history of intermittent tremor in her head.  She had originally not noticed it but her children had noticed it.  She herself has noticed it off and on when she looks at her cell phone or plays on it.  It does not actually bother her, she has no sustained muscle contractions, no abnormal neck mobility issues or pain.  She does not have a hand tremor.  She recalls a paternal aunt who had a tremor.  She had it in her hands.  Her dad lived to be in his 40s and died in a house fire.  She has 2 brothers, one half brother, and one half sister, neither siblings have any tremors.  She has not fallen recently.  She does try to hydrate well with water.  She lives with her husband.  She works at [employer], at the entrance.  She is a non-smoker and does not drink any alcohol.  She does not drink caffeine daily.  She sleeps fairly well but does have sleep disruption due to nocturia which is about 2-3 times per average night, is not aware of any snoring or apneas.   I reviewed your office note from 09/26/2022.  She was noted to have a head tremor.  She reported that she had not tremor for over 1 year.  She had blood work through your office on 08/11/2022 and I reviewed the results.  CBC with differential was benign, CMP showed glucose of 79, BUN 10, creatinine 0.84, sodium 138, potassium 3.7, alk  phos 110, AST 23, ALT 10.  Lipid panel showed mildly elevated total cholesterol at 229, LDL elevated at 151, triglycerides normal at 79.  Vitamin B12 was 1157, folate 18.6.  I did not see a recent TSH.  Her Past Medical History Is Significant For: Past Medical History:  Diagnosis Date   Dysplasia of cervix    Hyperlipidemia    Hypertension    Obesity    Sarcoid     Her Past Surgical History Is Significant For: Past Surgical History:  Procedure Laterality Date   APPENDECTOMY     LEEP  05/04/1999   UMBILICAL HERNIA REPAIR      Her Family History Is Significant For: Family History  Problem Relation Age of Onset   Tremor Paternal Aunt    Tuberculosis Other        grandfather   Breast cancer Neg Hx    Parkinson's disease Neg Hx     Her Social History Is Significant For: Social History   Socioeconomic History   Marital status: Married    Spouse name: Not on file   Number of children: Y   Years of education: Not on file   Highest education level: Not on file  Occupational History   Occupation: receiving clerk    Employer: [id number]  Tobacco Use   Smoking status: Former    Packs/day: 0.50  Years: 20.00    Additional pack years: 0.00    Total pack years: 10.00    Types: Cigarettes    Quit date: 06/06/1999    Years since quitting: 23.4   Smokeless tobacco: Former  Substance and Sexual Activity   Alcohol use: No   Drug use: No   Sexual activity: Not on file  Other Topics Concern   Not on file  Social History Narrative   Pt has birds in her home.    Social Determinants of Health   Financial Resource Strain: Not on file  Food Insecurity: Not on file  Transportation Needs: Not on file  Physical Activity: Not on file  Stress: Not on file  Social Connections: Not on file    Her Allergies Are:  No Known Allergies:   Her Current Medications Are:  Outpatient Encounter Medications as of 11/02/2022  Medication Sig   calcium carbonate (OS-CAL) 600 MG TABS Take 600  mg by mouth daily with breakfast. 1200mg  once daily   Cholecalciferol (VITAMIN D) 50 MCG (2000 UT) CAPS 1 tablet   hydrochlorothiazide 25 MG tablet Take 25 mg by mouth daily.   Magnesium 250 MG TABS Take 400 mg by mouth daily.   mometasone-formoterol (DULERA) 100-5 MCG/ACT AERO Inhale 2 puffs into the lungs 2 (two) times daily.   Potassium 99 MG TABS Take 99 mg by mouth daily.   tizanidine (ZANAFLEX) 2 MG capsule Take 2 mg by mouth 3 (three) times daily as needed.   tolterodine (DETROL LA) 4 MG 24 hr capsule Take 4 mg by mouth daily.   vitamin B-12 (CYANOCOBALAMIN) 1000 MCG tablet Take 1,000 mcg by mouth daily.   No facility-administered encounter medications on file as of 11/02/2022.  :   Review of Systems:  Out of a complete 14 point review of systems, all are reviewed and negative with the exception of these symptoms as listed below:  Review of Systems  Neurological:        Pt here for tremors Pt states tremors are in head only     Objective:  Neurological Exam  Physical Exam Physical Examination:   Vitals:   11/02/22 0858  BP: 127/81  Pulse: 77    General Examination: The patient is a very pleasant 67 y.o. female in no acute distress. She appears well-developed and well-nourished and well groomed.   HEENT: Normocephalic, atraumatic, pupils are equal, round and reactive to light, extraocular tracking is good without limitation to gaze excursion or nystagmus noted. Corrective eye glasses in place. Hearing is grossly intact. Face is symmetric with normal facial animation. Speech is clear with no dysarthria noted. There is no hypophonia. There is no lip, or voice tremor. Mild, intermittent side to side head tremor. Neck is supple with full range of passive and active motion. There are no carotid bruits on auscultation. Oropharynx exam reveals: mild mouth dryness, adequate dental hygiene and moderate airway crowding. Tongue protrudes centrally and palate elevates symmetrically.    Chest: Clear to auscultation without wheezing, rhonchi or crackles noted.  Heart: S1+S2+0, regular and normal without murmurs, rubs or gallops noted.   Abdomen: Soft, non-tender and non-distended.  Extremities: There is no pitting edema in the distal lower extremities bilaterally.   Skin: Warm and dry without trophic changes noted.   Musculoskeletal: exam reveals no obvious joint deformities.   Neurologically:  Mental status: The patient is awake, alert and oriented in all 4 spheres. Her immediate and remote memory, attention, language skills and fund of knowledge  are appropriate. There is no evidence of aphasia, agnosia, apraxia or anomia. Speech is clear with normal prosody and enunciation. Thought process is linear. Mood is normal and affect is normal.  Cranial nerves II - XII are as described above under HEENT exam.  Motor exam: Normal bulk, strength and tone is noted.  There is no tremor, no significant postural or action tremor in the upper extremities, no lower extremity tremor, no resting or intention tremor.   Reflexes are 1+ throughout. On 11/02/2022: On Archimedes spiral drawing she has no significant trembling with either hand, handwriting with her right hand is legible, not tremulous, not micrographic.  Fine motor skills and coordination: grossly intact.  Cerebellar testing: No dysmetria or intention tremor. There is no truncal or gait ataxia.  Sensory exam: intact to light touch in the upper and lower extremities.  Gait, station and balance: She stands easily. No veering to one side is noted. No leaning to one side is noted. Posture is age-appropriate and stance is narrow based. Gait shows normal stride length and normal pace. No problems turning are noted.  Romberg is negative, tandem walk slightly challenging in the beginning, but doable.   Assessment and Plan:  In summary, Elianah Burrowes is a very pleasant 67 y.o.-year old female with an underlying medical history  of vitamin D deficiency, vitamin B12 deficiency, sarcoidosis, hyperlipidemia, meralgia paresthetica, anxiety, and obesity, who presents for evaluation of her head tremor of about 1 years duration.  History and examination is not in keeping with parkinsonism or dystonia.  She likely has a mild form of essential tremor with a benign head tremor without any significant appendicular tremor.  We talked about tremor conditions at length today and also triggers as well as alleviating factors.  She is encouraged to stay well-hydrated and well rested and continue to monitor her tremor.  She is not particularly bothered by it, she is not keen on any symptomatic medication.  She is advised that tremors can fluctuate, but overall she is largely reassured today.  She is advised that hereditary tremors can get worse over time and that there is no curative treatment but we can consider symptomatic treatment down the road if the need arises.  She is agreeable to monitoring her symptoms and I would be happy to see her back for a recheck in about a year routinely, sooner if needed.  She is advised to make sure her thyroid function has been checked through your office, she can have it checked with her next scheduled blood work as well.  I answered all her questions today and she was in agreement with our plan.   Thank you very much for allowing me to participate in the care of this nice patient. If I can be of any further assistance to you please do not hesitate to call me at [phone].  Sincerely,   Huston Foley, MD, PhD

## 2022-11-23 ENCOUNTER — Telehealth: Payer: Self-pay | Admitting: Pulmonary Disease

## 2022-11-23 NOTE — Telephone Encounter (Signed)
Patient called to cancel her ct states she can't afford another big bill it was going to be 900 something out of pocket

## 2022-11-23 NOTE — Telephone Encounter (Signed)
Patient needs to cancel CT scan for 11/24/2022. Was told to call the office to cancel. Patient phone number is [phone].

## 2022-11-23 NOTE — Telephone Encounter (Signed)
Left message on patient's voicemail. OK to postpone CT scan. It was not urgent and only for surveillance for her sarcoid. Will see patient in September with PFTs as scheduled.

## 2022-11-24 ENCOUNTER — Ambulatory Visit (HOSPITAL_BASED_OUTPATIENT_CLINIC_OR_DEPARTMENT_OTHER): Payer: BC Managed Care – PPO

## 2022-12-15 DIAGNOSIS — E7800 Pure hypercholesterolemia, unspecified: Secondary | ICD-10-CM | POA: Diagnosis not present

## 2022-12-21 ENCOUNTER — Ambulatory Visit: Payer: BC Managed Care – PPO | Admitting: Neurology

## 2023-02-16 DIAGNOSIS — E538 Deficiency of other specified B group vitamins: Secondary | ICD-10-CM | POA: Diagnosis not present

## 2023-02-16 DIAGNOSIS — E7800 Pure hypercholesterolemia, unspecified: Secondary | ICD-10-CM | POA: Diagnosis not present

## 2023-02-16 DIAGNOSIS — Z23 Encounter for immunization: Secondary | ICD-10-CM | POA: Diagnosis not present

## 2023-02-16 DIAGNOSIS — I10 Essential (primary) hypertension: Secondary | ICD-10-CM | POA: Diagnosis not present

## 2023-02-16 DIAGNOSIS — R251 Tremor, unspecified: Secondary | ICD-10-CM | POA: Diagnosis not present

## 2023-02-23 ENCOUNTER — Ambulatory Visit (INDEPENDENT_AMBULATORY_CARE_PROVIDER_SITE_OTHER): Payer: BC Managed Care – PPO | Admitting: Pulmonary Disease

## 2023-02-23 ENCOUNTER — Encounter (HOSPITAL_BASED_OUTPATIENT_CLINIC_OR_DEPARTMENT_OTHER): Payer: BC Managed Care – PPO

## 2023-02-23 DIAGNOSIS — D869 Sarcoidosis, unspecified: Secondary | ICD-10-CM

## 2023-02-23 LAB — PULMONARY FUNCTION TEST
DL/VA % pred: 97 %
DL/VA: 4.22 ml/min/mmHg/L
DLCO cor % pred: 76 %
DLCO cor: 12.73 ml/min/mmHg
DLCO unc % pred: 76 %
DLCO unc: 12.73 ml/min/mmHg
FEF 25-75 Post: 1.34 L/sec
FEF 25-75 Pre: 1.57 L/sec
FEF2575-%Change-Post: -14 %
FEF2575-%Pred-Post: 72 %
FEF2575-%Pred-Pre: 84 %
FEV1-%Change-Post: -3 %
FEV1-%Pred-Post: 81 %
FEV1-%Pred-Pre: 84 %
FEV1-Post: 1.6 L
FEV1-Pre: 1.66 L
FEV1FVC-%Change-Post: 2 %
FEV1FVC-%Pred-Pre: 104 %
FEV6-%Change-Post: -6 %
FEV6-%Pred-Post: 78 %
FEV6-%Pred-Pre: 84 %
FEV6-Post: 1.94 L
FEV6-Pre: 2.07 L
FEV6FVC-%Change-Post: 0 %
FEV6FVC-%Pred-Post: 104 %
FEV6FVC-%Pred-Pre: 104 %
FVC-%Change-Post: -6 %
FVC-%Pred-Post: 75 %
FVC-%Pred-Pre: 80 %
FVC-Post: 1.94 L
FVC-Pre: 2.07 L
Post FEV1/FVC ratio: 83 %
Post FEV6/FVC ratio: 100 %
Pre FEV1/FVC ratio: 80 %
Pre FEV6/FVC Ratio: 100 %
RV % pred: 73 %
RV: 1.36 L
TLC % pred: 79 %
TLC: 3.44 L

## 2023-02-23 NOTE — Patient Instructions (Signed)
Full PFT Performed Today  

## 2023-02-23 NOTE — Progress Notes (Signed)
Full PFT Performed Today  

## 2023-02-26 ENCOUNTER — Telehealth: Payer: Self-pay | Admitting: Pulmonary Disease

## 2023-02-26 NOTE — Telephone Encounter (Signed)
Patient would like to schedule CT scan. Patient phone number is [phone].

## 2023-02-28 NOTE — Telephone Encounter (Signed)
CT Scheduled and patient aware

## 2023-02-28 NOTE — Telephone Encounter (Signed)
Left message for the patient to call back.

## 2023-03-02 ENCOUNTER — Ambulatory Visit (HOSPITAL_BASED_OUTPATIENT_CLINIC_OR_DEPARTMENT_OTHER): Payer: BC Managed Care – PPO | Admitting: Pulmonary Disease

## 2023-03-02 ENCOUNTER — Encounter (HOSPITAL_BASED_OUTPATIENT_CLINIC_OR_DEPARTMENT_OTHER): Payer: Self-pay | Admitting: Pulmonary Disease

## 2023-03-02 ENCOUNTER — Encounter (HOSPITAL_BASED_OUTPATIENT_CLINIC_OR_DEPARTMENT_OTHER): Payer: BC Managed Care – PPO

## 2023-03-02 VITALS — BP 108/70 | HR 79 | Resp 16 | Ht 59.0 in | Wt 185.6 lb

## 2023-03-02 DIAGNOSIS — D869 Sarcoidosis, unspecified: Secondary | ICD-10-CM | POA: Diagnosis not present

## 2023-03-02 NOTE — Patient Instructions (Signed)
Pulmonary sarcoidosis  - Annual PFTs.  Reviewed and stable 02/2023 - Stable CT Chest with unchanged opacities. Scheduled for repeat for 03/2023. Will mychart if stable

## 2023-03-02 NOTE — Progress Notes (Unsigned)
Subjective:   PATIENT ID: [name] GENDER: female DOB: [birth year], MRN: [id number]   HPI  Chief Complaint  Patient presents with   Follow-up    Had had PFT but not CT. Breathing has been alright.   Reason for Visit: Follow-up   [name] is a 67 year old female with pulmonary sarcoidosis, allergic rhinitis, GERD who presents for follow-up for inhaler management.  Synopsis:  Initially seen by Dr. Delton Coombes with the last visit on 08/13/15 for management of her sarcoid. She has had prior hx of chronic dry cough that has waxed and waned since 2011. She was previously on prednisone taper and was discontinued due to hyperglycemia/DM. Document reports owning birds in the home since 2001 though patient reports minimal exposure. Since her last visit in 2017, she does report resolved cough. However reports chronic cough restarted since early November 2021.  2021 - Treated for chronic cough with Dulera. Cough resolved and Dulera was discontinued 2022 - COVID-19 infection in July, no hospitalization. Progression of sarcoid on CT chest compared to 2017 however on repeat CT stable 2023 - Intermittently on Dulera. Self discontinued in July and asymptomatic  10/27/22 She was recently restarted on [facility] after a covid infection in 07/2022. She self-discontinued this in April and overall has had no issues. But is starting to fill short of breath in the last few weeks but believes this is related to recent weight gain >10lbs. She is planning to cut down on sugary snacks and drinks. She plans to re-trial the [facility] again in case this is still a breathing issue. Denies cough or wheezing. No limitations in activity  03/02/23 Since our last visit denies significant respiratory symptoms. Shortness of breath only with exertion. No cough or wheezing. Overall doing well. Not currently on inhalers.   Social History: [name] exposure since 2001  Past Medical History:  Diagnosis Date   Dysplasia of  cervix    Hyperlipidemia    Hypertension    Obesity    Sarcoid      Family History  Problem Relation Age of Onset   Tremor Paternal Aunt    Tuberculosis Other        grandfather   Breast cancer Neg Hx    Parkinson's disease Neg Hx      Social History   Occupational History   Occupation: receiving Marine scientist: [id number]  Tobacco Use   Smoking status: Former    Current packs/day: 0.00    Average packs/day: 0.5 packs/day for 20.0 years (10.0 ttl pk-yrs)    Types: Cigarettes    Start date: 06/06/1979    Quit date: 06/06/1999    Years since quitting: 23.7   Smokeless tobacco: Former  Substance and Sexual Activity   Alcohol use: No   Drug use: No   Sexual activity: Not on file    No Known Allergies   Outpatient Medications Prior to Visit  Medication Sig Dispense Refill   calcium carbonate (OS-CAL) 600 MG TABS Take 600 mg by mouth daily with breakfast. 1200mg  once daily     Cholecalciferol (VITAMIN D) 50 MCG (2000 UT) CAPS 1 tablet     hydrochlorothiazide 25 MG tablet Take 25 mg by mouth daily.     Magnesium 250 MG TABS Take 400 mg by mouth daily.     mometasone-formoterol (DULERA) 100-5 MCG/ACT AERO Inhale 2 puffs into the lungs 2 (two) times daily. 1 each 2   Potassium 99 MG TABS Take 99 mg  by mouth daily.     tizanidine (ZANAFLEX) 2 MG capsule Take 2 mg by mouth 3 (three) times daily as needed.     tolterodine (DETROL LA) 4 MG 24 hr capsule Take 4 mg by mouth daily.     vitamin B-12 (CYANOCOBALAMIN) 1000 MCG tablet Take 1,000 mcg by mouth daily.     No facility-administered medications prior to visit.    Review of Systems  Constitutional:  Negative for chills, diaphoresis, fever, malaise/fatigue and weight loss.  HENT:  Negative for congestion.   Respiratory:  Positive for shortness of breath. Negative for cough, hemoptysis, sputum production and wheezing.   Cardiovascular:  Negative for chest pain, palpitations and leg swelling.     Objective:   Vitals:    03/02/23 1022  BP: 108/70  Pulse: 79  Resp: 16  SpO2: 94%  Weight: 185 lb 9.6 oz (84.2 kg)  Height: 4\' 11"  (1.499 m)   SpO2: 94 %  Physical Exam: General: Well-appearing, no acute distress HENT: [place], AT Eyes: EOMI, no scleral icterus Respiratory: Clear to auscultation bilaterally.  No crackles, wheezing or rales Cardiovascular: RRR, -M/R/G, no JVD Extremities:-Edema,-tenderness Neuro: AAO x4, CNII-XII grossly intact Psych: Normal mood, normal affect   Data Reviewed:  Imaging: CT Chest 08/12/15 - Calcified lymph nodes and chronic, mild subpleural and reticulonodular interstitial changes in the upper lobes suspected to be related to sarcoid  CT Chest 07/23/20 - Progression of parenchymal disease with multifocal masses bilaterally, fibrosis. Unchanged calcified mediastinal and bilateral hilar lymph nodes. Mild centrilobular emphysema CT Chest 01/14/21 - Unchanged parenchymal masses and scarring. Unchanged calcified mediastinal and hilar lymph nodes, calcified. These findings are consistent with sarcoidosis. Background mild emphysema CT Chest 02/02/22 - Stable opacities in the upper lobes and lower lobes bilaterally.  PFT: 08/13/15 FVC 2.5 (112%) FEV1 1.9 (105%) Ratio 77   Interpretation: Normal spirometry. Normal FEV1 and FVC.  07/16/20 FVC 2.13 (98%) FEV1 1.78 (105%) Ratio 82  TLC 79% DLCO 72% Interpretation: Mild restrictive disease with reduced gas exchange. No obstructive defect however F-V demonstrate small airway defect. No significant bronchodilator response  01/17/21 FVC 2.10 (98%) FEV1 1.75 (104%) Ratio 84  TLC 72% DLCO 72% Interpretation: Mild restrictive defect with mildly reduced gas exchange. Overall stable compared to prior PFT in Feb 2022.  02/24/22 FVC 1.98 (72%) FEV1 1.68 (80%) Ratio 83  TLC 70% DLCO 73% Interpretation: Mild restrictive defect with mildly reduced gas exchange. Reduced FVC and FEV1 however remains mild restrictive severity  02/23/23 FVC 1.94 (75%)  FEV1 1.60 (81%) Ratio 83  TLC 79% DLCO 76% Interpretation: Borderline restrictive and mildly reduced exchange gas exchange      Latest Ref Rng & Units 02/24/2022    2:04 PM 01/17/2021   11:35 AM  CBC  WBC 4.0 - 10.5 K/uL 8.9  8.5   Hemoglobin 12.0 - 15.0 g/dL 46.9  62.9   Hematocrit 36.0 - 46.0 % 42.5  40.1   Platelets 150.0 - 400.0 K/uL 253.0  254.0    CBC with diff 08/11/22 at [facility] WBC 10.1 Hg 12.9 Plt 290     Latest Ref Rng & Units 02/24/2022    2:04 PM 01/17/2021   11:35 AM  CMP  Glucose 70 - 99 mg/dL 95  86   BUN 6 - 23 mg/dL 11  13   Creatinine 5.28 - 1.20 mg/dL 4.13  2.44   Sodium 010 - 145 mEq/L 139  139   Potassium 3.5 - 5.1 mEq/L 3.2  3.8   Chloride 96 - 112 mEq/L 100  101   CO2 19 - 32 mEq/L 28  28   Calcium 8.4 - 10.5 mg/dL 78.2  95.6   Total Protein 6.0 - 8.3 g/dL 8.2  7.8   Total Bilirubin 0.2 - 1.2 mg/dL 0.4  0.5   Alkaline Phos 39 - 117 U/L 78  70   AST 0 - 37 U/L 21  15   ALT 0 - 35 U/L 9  4       Assessment & Plan:   Discussion: 67 year old female with pulmonary sarcoid who presents for follow-up for PFTs. Intermittnetly on Dulera but not currently on any. Need seems to occur with infection PRN.  We discussed the clinical course of sarcoid and management including serial PFTs, labs, eye exam, and EKG and chest imaging if indicated. If symptoms suggest sarcoid flare in the future, we would manage with steroids +/- biologics.  Pulmonary sarcoidosis with restrictive defect +reduced gas exchange - Dx in bronchoscopy via transbronchial bx in 05/2010 - No indication for prednisone therapy - Annual PFTs.  Reviewed and stable 02/2023 - Stable CT Chest with unchanged opacities. Scheduled for repeat for 03/2023. Will mychart if stable - Annual ophthalmology exam.  Last 02/2022. Scheduled for 03/2023 - EKG with incomplete RBBB. Consider echo in future if symptomatic - At next visit: CBC with diff (complete blood count with differential) and CMET (complete  metabolic panel)  Allergic Rhinitis - CONTINUE allergy medication (loratadine or cetirizine)   Health Maintenance Immunization History  Administered Date(s) Administered   Influenza Split 03/23/2017, 03/29/2018, 02/18/2019, 02/28/2020   Influenza, High Dose Seasonal PF 02/16/2023   Influenza,inj,Quad PF,6+ Mos 02/18/2019   PFIZER(Purple Top)SARS-COV-2 Vaccination 08/14/2019, 09/10/2019, 04/05/2020   Pneumococcal Conjugate-13 06/01/2020   Td 12/09/2015   Tdap 02/28/2006   Zoster Recombinant(Shingrix) 05/18/2018   Zoster, Live 01/14/2018, 05/18/2018   CT Lung Screen - not indicated  Orders Placed This Encounter  Procedures   Pulmonary function test    Standing Status:   Future    Standing Expiration Date:   03/01/2024    Scheduling Instructions:     Schedule in 02/2024    Order Specific Question:   Where should this test be performed?    Answer:   [facility]    Order Specific Question:   Full PFT: includes the following: basic spirometry, spirometry pre & post bronchodilator, diffusion capacity (DLCO), lung volumes    Answer:   Full PFT   No orders of the defined types were placed in this encounter.   Return in about 1 year (around 03/01/2024) for after PFT.  I have spent a total time of 33-minutes on the day of the appointment including chart review, data review, collecting history, coordinating care and discussing medical diagnosis and plan with the patient/family. Past medical history, allergies, medications were reviewed. Pertinent imaging, labs and tests included in this note have been reviewed and interpreted independently by me.   Munirah Doerner Mechele Collin, MD [facility] 03/02/2023 10:39 AM

## 2023-03-05 ENCOUNTER — Encounter (HOSPITAL_BASED_OUTPATIENT_CLINIC_OR_DEPARTMENT_OTHER): Payer: Self-pay | Admitting: Pulmonary Disease

## 2023-03-14 ENCOUNTER — Ambulatory Visit
Admission: RE | Admit: 2023-03-14 | Discharge: 2023-03-14 | Disposition: A | Discharge status: Home / Self Care | Payer: BC Managed Care – PPO | Source: Ambulatory Visit | Attending: Pulmonary Disease | Admitting: Pulmonary Disease

## 2023-03-14 DIAGNOSIS — D860 Sarcoidosis of lung: Secondary | ICD-10-CM | POA: Diagnosis not present

## 2023-03-14 DIAGNOSIS — D869 Sarcoidosis, unspecified: Secondary | ICD-10-CM

## 2023-04-19 ENCOUNTER — Other Ambulatory Visit: Payer: Self-pay | Admitting: Family Medicine

## 2023-04-19 DIAGNOSIS — Z0000 Encounter for general adult medical examination without abnormal findings: Secondary | ICD-10-CM

## 2023-05-11 DIAGNOSIS — R350 Frequency of micturition: Secondary | ICD-10-CM | POA: Diagnosis not present

## 2023-05-11 DIAGNOSIS — N3946 Mixed incontinence: Secondary | ICD-10-CM | POA: Diagnosis not present

## 2023-05-11 DIAGNOSIS — R8271 Bacteriuria: Secondary | ICD-10-CM | POA: Diagnosis not present

## 2023-06-07 ENCOUNTER — Telehealth: Payer: Self-pay | Admitting: Neurology

## 2023-06-07 NOTE — Telephone Encounter (Signed)
 Rescheduled appt for earlier appt due to head shaking.

## 2023-06-08 ENCOUNTER — Ambulatory Visit
Admission: RE | Admit: 2023-06-08 | Discharge: 2023-06-08 | Disposition: A | Discharge status: Home / Self Care | Payer: BC Managed Care – PPO | Source: Ambulatory Visit | Attending: Family Medicine | Admitting: Family Medicine

## 2023-06-08 DIAGNOSIS — Z0000 Encounter for general adult medical examination without abnormal findings: Secondary | ICD-10-CM

## 2023-07-29 ENCOUNTER — Other Ambulatory Visit: Payer: Self-pay | Admitting: Pulmonary Disease

## 2023-08-04 ENCOUNTER — Other Ambulatory Visit: Payer: Self-pay | Admitting: Pulmonary Disease

## 2023-08-16 ENCOUNTER — Encounter: Payer: Self-pay | Admitting: Neurology

## 2023-08-16 ENCOUNTER — Ambulatory Visit: Payer: BC Managed Care – PPO | Admitting: Neurology

## 2023-08-16 VITALS — BP 118/77 | HR 88 | Ht 60.0 in | Wt 185.0 lb

## 2023-08-16 DIAGNOSIS — G250 Essential tremor: Secondary | ICD-10-CM | POA: Diagnosis not present

## 2023-08-16 DIAGNOSIS — Z9189 Other specified personal risk factors, not elsewhere classified: Secondary | ICD-10-CM | POA: Diagnosis not present

## 2023-08-16 NOTE — Progress Notes (Signed)
 Subjective:    Patient ID: [name] is a 68 y.o. female.  HPI    Interim history:   [name] is a 68 year old female with an underlying medical history of vitamin D deficiency, vitamin B12 deficiency, sarcoidosis, hyperlipidemia, meralgia paresthetica, anxiety, and obesity, who presents for follow-up consultation of her tremor disorder.  The patient is unaccompanied today and presents for her 1 year checkup.  I first met her at the request of her primary care provider, at which time the patient reported an approximately 1 year history of intermittent head tremors.  Examination and history were in keeping with mild essential tremor presenting primarily as a benign head tremor.  We mutually agreed to monitor her symptoms and her examination and not try any tremor medication for symptomatic relief as her tremor was mild and she was not particularly bothered by it.  She was advised to follow-up routinely in 1 year.  Today, 08/16/2023: She reports that her tremor may have become a little worse.  She is not often bothered by it but it is embarrassing to her socially.  Her daughter took a video of her while she was standing looking out and the video is from behind.  She has had some people ask about it.  She has not found anything that triggers it or makes it better, she certainly does not shake when she is relaxed trying to fall asleep or during sleep.  She goes to bed sometimes as early as 6 PM, she gets up at 4.  She does not have any difficulty sleeping and declines a home sleep test at this time.  Her husband has not complained about any snoring or breathing irregularities.  She tries to hydrate well, does not drink any daily caffeine, does not drink any alcohol.   The patient's allergies, current medications, family history, past medical history, past social history, past surgical history and problem list were reviewed and updated as appropriate.   Previously:   11/02/2022: (She)  reports an approximately 1+ year history of intermittent tremor in her head.  She had originally not noticed it but her children had noticed it.  She herself has noticed it off and on when she looks at her cell phone or plays on it.  It does not actually bother her, she has no sustained muscle contractions, no abnormal neck mobility issues or pain.  She does not have a hand tremor.  She recalls a paternal aunt who had a tremor.  She had it in her hands.  Her dad lived to be in his 64s and died in a house fire.  She has 2 brothers, one half brother, and one half sister, neither siblings have any tremors.  She has not fallen recently.  She does try to hydrate well with water.  She lives with her husband.  She works at [employer], at the entrance.  She is a non-smoker and does not drink any alcohol.  She does not drink caffeine daily.  She sleeps fairly well but does have sleep disruption due to nocturia which is about 2-3 times per average night, is not aware of any snoring or apneas.    I reviewed your office note from 09/26/2022.  She was noted to have a head tremor.  She reported that she had not tremor for over 1 year.  She had blood work through your office on 08/11/2022 and I reviewed the results.  CBC with differential was benign, CMP showed glucose of 79, BUN 10,  creatinine 0.84, sodium 138, potassium 3.7, alk phos 110, AST 23, ALT 10.  Lipid panel showed mildly elevated total cholesterol at 229, LDL elevated at 151, triglycerides normal at 79.  Vitamin B12 was 1157, folate 18.6.  I did not see a recent TSH.   Her Past Medical History Is Significant For: Past Medical History:  Diagnosis Date   Dysplasia of cervix    Hyperlipidemia    Hypertension    Obesity    Sarcoid     Her Past Surgical History Is Significant For: Past Surgical History:  Procedure Laterality Date   APPENDECTOMY     LEEP  05/04/1999   UMBILICAL HERNIA REPAIR      Her Family History Is Significant For: Family History   Problem Relation Age of Onset   Tremor Paternal Aunt    Tuberculosis Other        grandfather   Breast cancer Neg Hx    Parkinson's disease Neg Hx     Her Social History Is Significant For: Social History   Socioeconomic History   Marital status: Married    Spouse name: Not on file   Number of children: Y   Years of education: Not on file   Highest education level: Not on file  Occupational History   Occupation: receiving clerk    Employer: [id number]  Tobacco Use   Smoking status: Former    Current packs/day: 0.00    Average packs/day: 0.5 packs/day for 20.0 years (10.0 ttl pk-yrs)    Types: Cigarettes    Start date: 06/06/1979    Quit date: 06/06/1999    Years since quitting: 24.2   Smokeless tobacco: Former  Substance and Sexual Activity   Alcohol use: No   Drug use: No   Sexual activity: Not on file  Other Topics Concern   Not on file  Social History Narrative   Pt has birds in her home.    Social Drivers of Corporate investment banker Strain: Not on file  Food Insecurity: Not on file  Transportation Needs: Not on file  Physical Activity: Not on file  Stress: Not on file  Social Connections: Not on file    Her Allergies Are:  No Known Allergies:   Her Current Medications Are:  Outpatient Encounter Medications as of 08/16/2023  Medication Sig   calcium carbonate (OS-CAL) 600 MG TABS Take 600 mg by mouth daily with breakfast. 1200mg  once daily   Cholecalciferol (VITAMIN D) 50 MCG (2000 UT) CAPS 1 tablet   hydrochlorothiazide 25 MG tablet Take 25 mg by mouth daily.   lovastatin (MEVACOR) 40 MG tablet Take 40 mg by mouth at bedtime.   Magnesium 250 MG TABS Take 400 mg by mouth daily.   mometasone-formoterol (DULERA) 100-5 MCG/ACT AERO Inhale 2 puffs by mouth twice daily   Potassium 99 MG TABS Take 99 mg by mouth daily.   tizanidine (ZANAFLEX) 2 MG capsule Take 2 mg by mouth 3 (three) times daily as needed.   tolterodine (DETROL LA) 4 MG 24 hr capsule Take 4 mg  by mouth daily.   vitamin B-12 (CYANOCOBALAMIN) 1000 MCG tablet Take 1,000 mcg by mouth daily.   No facility-administered encounter medications on file as of 08/16/2023.  :  Review of Systems:  Out of a complete 14 point review of systems, all are reviewed and negative with the exception of these symptoms as listed below:  Review of Systems  Neurological:        Patient in room #  9 and alone. Patient states her head tremors has gotten worse.    Objective:  Neurological Exam  Physical Exam Physical Examination:   Vitals:   08/16/23 0736  BP: 118/77  Pulse: 88    General Examination: The patient is a very pleasant 68 y.o. female in no acute distress. She appears well-developed and well-nourished and well groomed.   HEENT: Normocephalic, atraumatic, pupils are equal, round and reactive to light, extraocular tracking is good without limitation to gaze excursion or nystagmus noted. Corrective eye glasses in place. Hearing is grossly intact. Face is symmetric with normal facial animation. Speech is clear with no dysarthria noted. There is no hypophonia. There is no lip, or voice tremor. Mild, intermittent side to side head tremor, appears to be quite stable compared to last exam.  Neck is supple with full range of passive and active motion. There are no carotid bruits on auscultation. Oropharynx exam reveals: No significant mouth dryness, adequate dental hygiene and moderate airway crowding. Tongue protrudes centrally and palate elevates symmetrically.  She removes her face mask briefly for airway examination.   Chest: Clear to auscultation without wheezing, rhonchi or crackles noted.   Heart: S1+S2+0, regular and normal without murmurs, rubs or gallops noted.    Abdomen: Soft, non-tender and non-distended.   Extremities: There is no pitting edema in the distal lower extremities bilaterally but she has bilateral ankle puffiness and a sock line above her ankle socks, right worse than left.     Skin: Warm and dry without trophic changes noted.    Musculoskeletal: exam reveals no obvious joint deformities.    Neurologically:  Mental status: The patient is awake, alert and oriented in all 4 spheres. Her immediate and remote memory, attention, language skills and fund of knowledge are appropriate. There is no evidence of aphasia, agnosia, apraxia or anomia. Speech is clear with normal prosody and enunciation. Thought process is linear. Mood is normal and affect is normal.  Cranial nerves II - XII are as described above under HEENT exam.  Motor exam: Normal bulk, strength and tone is noted.  There is no tremor, no significant postural or action tremor in the upper extremities, no lower extremity tremor, no resting or intention tremor.  No drift or rebound. Reflexes are 1+ throughout.  Toes are downgoing bilaterally.  (On 11/02/2022: On Archimedes spiral drawing she has no significant trembling with either hand, handwriting with her right hand is legible, not tremulous, not micrographic.)   Fine motor skills and coordination: Intact finger taps, hand movements and rapid alternating patting with both upper extremities, intact foot taps bilaterally in the lower extremities.    Cerebellar testing: No dysmetria or intention tremor. There is no truncal or gait ataxia.  Normal finger-to-nose, normal heel-to-shin bilaterally.  Sensory exam: intact to light touch in the upper and lower extremities.   Gait, station and balance: She stands easily. No veering to one side is noted. No leaning to one side is noted. Posture is age-appropriate and stance is narrow based. Gait shows normal stride length and normal pace. No problems turning are noted.    Assessment and Plan:  In summary, [name] is a very pleasant 68 y.o.-year old female with an underlying medical history of vitamin D deficiency, vitamin B12 deficiency, sarcoidosis, hyperlipidemia, meralgia paresthetica, anxiety, and  obesity, who presents for follow-up consultation of her benign head tremor of approximately 2 years duration.  She has no evidence of cervical dystonia or parkinsonism and is reassured today.  She is advised about common alleviating factors and triggers for tremor exacerbation.  She is offered a home sleep test for evaluation for sleep apnea but declines it today.  She is encouraged to call our office if she changes her mind.  She may be at risk for obstructive sleep apnea.  We talked about symptomatic treatment options for tremor.  She is advised that midline tremors are notoriously more difficult to treat compared to arm and hand tremors.  I suggested we could try a low-dose Mysoline at this time and went over potential side effects and also expectations with her.  I would like to avoid a beta-blocker at this time as she has a low normal blood pressure and it may drop too low.  She is not keen on starting any new medication at this time but wanted to get checked out.  She is encouraged to stay well-hydrated and well rested and continue to monitor her tremor.  She is advised to make an appointment for a routine recheck in this clinic in about 1 year, she can see one of our nurse practitioners.  I answered all her questions today and she was in agreement with our plan.  I spent 30 minutes in total face-to-face time and in reviewing records during pre-charting, more than 50% of which was spent in counseling and coordination of care, reviewing test results, reviewing medications and treatment regimen and/or in discussing or reviewing the diagnosis of head tremor, the prognosis and treatment options. Pertinent laboratory and imaging test results that were available during this visit with the patient were reviewed by me and considered in my medical decision making (see chart for details).

## 2023-08-16 NOTE — Patient Instructions (Signed)
 It was nice to see you again today.  In my assessment, your tremor has been stable.  But it can certainly fluctuate and it may have become a little worse over time as per your description.  We can consider a low-dose medication called Mysoline (generic name: Primidone) in the future.  As discussed, I would like to avoid a beta-blocker because your blood pressure is on the low normal side and a beta-blocker can drop your heart rate and also your blood pressure and cause dizziness and lightheadedness in the wake. Please stay well-hydrated and well rested.  We can consider a home sleep test for evaluation of sleep apnea as you may be at risk.  If you change your mind, you can always call our office.  Follow-up in 1 year to see one of our nurse practitioners, we will continue to monitor.

## 2023-11-01 ENCOUNTER — Other Ambulatory Visit: Payer: Self-pay | Admitting: Internal Medicine

## 2023-11-01 ENCOUNTER — Other Ambulatory Visit: Payer: Self-pay | Admitting: Family Medicine

## 2023-11-01 DIAGNOSIS — M542 Cervicalgia: Secondary | ICD-10-CM

## 2023-11-05 ENCOUNTER — Ambulatory Visit: Payer: BC Managed Care – PPO | Admitting: Neurology

## 2023-11-07 ENCOUNTER — Encounter: Payer: Self-pay | Admitting: Family Medicine

## 2023-11-08 ENCOUNTER — Encounter: Payer: Self-pay | Admitting: Family Medicine

## 2023-11-08 ENCOUNTER — Other Ambulatory Visit: Payer: Self-pay | Admitting: Family Medicine

## 2023-11-08 DIAGNOSIS — M546 Pain in thoracic spine: Secondary | ICD-10-CM

## 2023-11-09 ENCOUNTER — Ambulatory Visit
Admission: RE | Admit: 2023-11-09 | Discharge: 2023-11-09 | Disposition: A | Discharge status: Home / Self Care | Source: Ambulatory Visit | Attending: Family Medicine | Admitting: Family Medicine

## 2023-11-09 DIAGNOSIS — M546 Pain in thoracic spine: Secondary | ICD-10-CM

## 2023-11-09 DIAGNOSIS — M542 Cervicalgia: Secondary | ICD-10-CM

## 2023-12-17 ENCOUNTER — Other Ambulatory Visit: Payer: Self-pay | Admitting: Pulmonary Disease

## 2024-01-11 ENCOUNTER — Other Ambulatory Visit: Payer: Self-pay | Admitting: Pulmonary Disease

## 2024-02-06 ENCOUNTER — Other Ambulatory Visit: Payer: Self-pay | Admitting: Pulmonary Disease

## 2024-02-29 ENCOUNTER — Other Ambulatory Visit: Payer: Self-pay | Admitting: *Deleted

## 2024-02-29 ENCOUNTER — Ambulatory Visit (INDEPENDENT_AMBULATORY_CARE_PROVIDER_SITE_OTHER): Admitting: *Deleted

## 2024-02-29 DIAGNOSIS — D869 Sarcoidosis, unspecified: Secondary | ICD-10-CM | POA: Diagnosis not present

## 2024-02-29 NOTE — Progress Notes (Signed)
 Full PFT performed today.

## 2024-02-29 NOTE — Patient Instructions (Signed)
 Full PFT performed today.

## 2024-03-04 ENCOUNTER — Encounter

## 2024-03-04 ENCOUNTER — Encounter (HOSPITAL_BASED_OUTPATIENT_CLINIC_OR_DEPARTMENT_OTHER): Payer: Self-pay | Admitting: Pulmonary Disease

## 2024-03-04 ENCOUNTER — Ambulatory Visit (HOSPITAL_BASED_OUTPATIENT_CLINIC_OR_DEPARTMENT_OTHER): Admitting: Pulmonary Disease

## 2024-03-04 VITALS — BP 128/81 | HR 84 | Ht 60.0 in | Wt 183.0 lb

## 2024-03-04 DIAGNOSIS — D869 Sarcoidosis, unspecified: Secondary | ICD-10-CM | POA: Diagnosis not present

## 2024-03-04 DIAGNOSIS — J42 Unspecified chronic bronchitis: Secondary | ICD-10-CM

## 2024-03-04 MED ORDER — DULERA 100-5 MCG/ACT IN AERO: 2.0000 | INHALATION_SPRAY | Freq: Two times a day (BID) | RESPIRATORY_TRACT | 11 refills | Status: DC

## 2024-03-04 NOTE — Patient Instructions (Addendum)
  Pulmonary sarcoidosis with restrictive defect +reduced gas exchange Chronic bronchitis - Continue Dulera 100 TWO puffs in the morning and evening - Annual PFTs. Reviewed 02/2024 PFTs with resolved restriction and mildly reduced gas exchange that is unchaged. - Stable CT Chest with unchanged opacities on 03/2023. Order for 03/2025

## 2024-03-04 NOTE — Progress Notes (Signed)
 Subjective:   PATIENT ID: [name] GENDER: female DOB: 07-Aug-1955, MRN: [id number]   HPI  Chief Complaint  Patient presents with   Follow-up    Sarcoidosis   Reason for Visit: Follow-up   [name] is a 68 year old female with pulmonary sarcoidosis, allergic rhinitis, GERD who presents for follow-up for sarcoid  Synopsis:  Initially seen by Dr. Shelah with the last visit on 08/13/15 for management of her sarcoid. She has had prior hx of chronic dry cough that has waxed and waned since 2011. She was previously on prednisone  taper and was discontinued due to hyperglycemia/DM. Document reports owning birds in the home since 2001 though patient reports minimal exposure. Since her last visit in 2017, she does report resolved cough. However reports chronic cough restarted since early November 2021.  2021 - Treated for chronic cough with Dulera. Cough resolved and Dulera was discontinued 2022 - COVID-19 infection in July, no hospitalization. Progression of sarcoid on CT chest compared to 2017 however on repeat CT stable 2023 - Intermittently on Dulera. Self discontinued in July and asymptomatic  10/27/22 She was recently restarted on Dulera after a covid infection in 07/2022. She self-discontinued this in April and overall has had no issues. But is starting to fill short of breath in the last few weeks but believes this is related to recent weight gain >10lbs. She is planning to cut down on sugary snacks and drinks. She plans to re-trial the Dulera again in case this is still a breathing issue. Denies cough or wheezing. No limitations in activity  03/02/23 Since our last visit denies significant respiratory symptoms. Shortness of breath only with exertion. No cough or wheezing. Overall doing well. Not currently on inhalers.   03/04/24 Since our last visit she is compliant with Dulera which she feels helps her shortness of breath is improved. Does have some pu No wheezing or  coughing. No limitation in activity.  Social History: [place] exposure since 2001  Past Medical History:  Diagnosis Date   Dysplasia of cervix    Hyperlipidemia    Hypertension    Obesity    Restrictive lung disease 08/04/2020   Sarcoid      Family History  Problem Relation Age of Onset   Tremor Paternal Aunt    Tuberculosis Other        grandfather   Breast cancer Neg Hx    Parkinson's disease Neg Hx      Social History   Occupational History   Occupation: receiving Marine scientist: [id number]  Tobacco Use   Smoking status: Former    Current packs/day: 0.00    Average packs/day: 0.5 packs/day for 20.0 years (10.0 ttl pk-yrs)    Types: Cigarettes    Start date: 06/06/1979    Quit date: 06/06/1999    Years since quitting: 24.7   Smokeless tobacco: Former  Substance and Sexual Activity   Alcohol use: No   Drug use: No   Sexual activity: Not on file    No Known Allergies   Outpatient Medications Prior to Visit  Medication Sig Dispense Refill   calcium carbonate (OS-CAL) 600 MG TABS Take 600 mg by mouth daily with breakfast. 1200mg  once daily     Cholecalciferol (VITAMIN D) 50 MCG (2000 UT) CAPS 1 tablet     hydrochlorothiazide 25 MG tablet Take 25 mg by mouth daily.     lovastatin (MEVACOR) 40 MG tablet Take 40 mg by mouth at bedtime.  Magnesium 250 MG TABS Take 400 mg by mouth daily.     Potassium 99 MG TABS Take 99 mg by mouth daily.     tizanidine (ZANAFLEX) 2 MG capsule Take 2 mg by mouth 3 (three) times daily as needed.     tolterodine (DETROL LA) 4 MG 24 hr capsule Take 4 mg by mouth daily.     vitamin B-12 (CYANOCOBALAMIN ) 1000 MCG tablet Take 1,000 mcg by mouth daily.     DULERA 100-5 MCG/ACT AERO Inhale 2 puffs by mouth twice daily 13 g 0   No facility-administered medications prior to visit.    Review of Systems  Constitutional:  Negative for chills, diaphoresis, fever, malaise/fatigue and weight loss.  HENT:  Negative for congestion.   Respiratory:   Positive for shortness of breath. Negative for cough, hemoptysis, sputum production and wheezing.   Cardiovascular:  Negative for chest pain, palpitations and leg swelling.     Objective:   Vitals:   03/04/24 1304  BP: 128/81  Pulse: 84  SpO2: 98%  Weight: 183 lb (83 kg)  Height: 5' (1.524 m)    SpO2: 98 %  Physical Exam: General: Well-appearing, no acute distress HENT: [place], AT Eyes: EOMI, no scleral icterus Respiratory: Clear to auscultation bilaterally.  No crackles, wheezing or rales Cardiovascular: RRR, -M/R/G, no JVD Extremities:-Edema,-tenderness Neuro: AAO x4, CNII-XII grossly intact Psych: Normal mood, normal affect   Data Reviewed:  Imaging: CT Chest 08/12/15 - Calcified lymph nodes and chronic, mild subpleural and reticulonodular interstitial changes in the upper lobes suspected to be related to sarcoid  CT Chest 07/23/20 - Progression of parenchymal disease with multifocal masses bilaterally, fibrosis. Unchanged calcified mediastinal and bilateral hilar lymph nodes. Mild centrilobular emphysema CT Chest 01/14/21 - Unchanged parenchymal masses and scarring. Unchanged calcified mediastinal and hilar lymph nodes, calcified. These findings are consistent with sarcoidosis. Background mild emphysema CT Chest 02/02/22 - Stable opacities in the upper lobes and lower lobes bilaterally.  PFT: 08/13/15 FVC 2.5 (112%) FEV1 1.9 (105%) Ratio 77   Interpretation: Normal spirometry. Normal FEV1 and FVC.  07/16/20 FVC 2.13 (98%) FEV1 1.78 (105%) Ratio 82  TLC 79% DLCO 72% Interpretation: Mild restrictive disease with reduced gas exchange. No obstructive defect however F-V demonstrate small airway defect. No significant bronchodilator response  01/17/21 FVC 2.10 (98%) FEV1 1.75 (104%) Ratio 84  TLC 72% DLCO 72% Interpretation: Mild restrictive defect with mildly reduced gas exchange. Overall stable compared to prior PFT in Feb 2022.  02/24/22 FVC 1.98 (72%) FEV1 1.68 (80%) Ratio 83   TLC 70% DLCO 73% Interpretation: Mild restrictive defect with mildly reduced gas exchange. Reduced FVC and FEV1 however remains mild restrictive severity  02/23/23 FVC 1.94 (75%) FEV1 1.60 (81%) Ratio 83  TLC 79% DLCO 76% Interpretation: Borderline restrictive and mildly reduced exchange gas exchange  02/29/24 FVC 2.17 (85%) FEV1 1.75 (90%) Ratio 81  TLC 86% DLCO 76% Interpretation: No obstructive or restrictive defect. Mildly reduced gas exchange       Latest Ref Rng & Units 02/24/2022    2:04 PM 01/17/2021   11:35 AM  CBC  WBC 4.0 - 10.5 K/uL 8.9  8.5   Hemoglobin 12.0 - 15.0 g/dL 86.1  87.1   Hematocrit 36.0 - 46.0 % 42.5  40.1   Platelets 150.0 - 400.0 K/uL 253.0  254.0    CBC with diff 08/11/22 at [facility] WBC 10.1 Hg 12.9 Plt 290     Latest Ref Rng & Units 02/24/2022  2:04 PM 01/17/2021   11:35 AM  CMP  Glucose 70 - 99 mg/dL 95  86   BUN 6 - 23 mg/dL 11  13   Creatinine 9.59 - 1.20 mg/dL 9.09  9.12   Sodium 864 - 145 mEq/L 139  139   Potassium 3.5 - 5.1 mEq/L 3.2  3.8   Chloride 96 - 112 mEq/L 100  101   CO2 19 - 32 mEq/L 28  28   Calcium 8.4 - 10.5 mg/dL 89.7  89.5   Total Protein 6.0 - 8.3 g/dL 8.2  7.8   Total Bilirubin 0.2 - 1.2 mg/dL 0.4  0.5   Alkaline Phos 39 - 117 U/L 78  70   AST 0 - 37 U/L 21  15   ALT 0 - 35 U/L 9  4   [facility] 02/29/24 : CMET normal range per Dr. Rolinda    Assessment & Plan:   Discussion: 68 year old female with pulmonary sarcoid who presents for follow-up for sarcoid. Reviewed PFTs and resolved restriction. Uses Dulera with improved shortness of breath.  We discussed the clinical course of sarcoid and management including serial PFTs, labs, eye exam, and EKG and chest imaging if indicated. If symptoms suggest sarcoid flare in the future, we would manage with steroids +/- biologics.  Pulmonary sarcoidosis with restrictive defect +reduced gas exchange Chronic bronchitis - Dx in bronchoscopy via transbronchial bx in 05/2010 -  No indication for prednisone  therapy - Continue Dulera 100 TWO puffs in the morning and evening - Annual PFTs. Reviewed 02/2024 PFTs with resolved restriction and mildly reduced gas exchange that is unchaged. - Stable CT Chest with unchanged opacities on 03/2023. Order for 03/2025 - Annual ophthalmology exam.  Last 02/2022. Scheduled for 03/2025 - EKG with incomplete RBBB. Consider echo in future if symptomatic  Allergic Rhinitis - CONTINUE allergy medication (loratadine  or cetirizine)   Health Maintenance Immunization History  Administered Date(s) Administered   Fluzone Influenza virus vaccine,trivalent (IIV3), split virus 03/23/2017, 03/29/2018, 02/18/2019, 02/28/2020   INFLUENZA, HIGH DOSE SEASONAL PF 02/16/2023   Influenza,inj,Quad PF,6+ Mos 02/18/2019   PFIZER(Purple Top)SARS-COV-2 Vaccination 08/14/2019, 09/10/2019, 04/05/2020   Pneumococcal Conjugate-13 06/01/2020   Td 12/09/2015   Tdap 02/28/2006   Zoster Recombinant(Shingrix) 05/18/2018   Zoster, Live 01/14/2018, 05/18/2018   CT Lung Screen - not indicated  Orders Placed This Encounter  Procedures   CT Chest Wo Contrast    Standing Status:   Future    Expiration Date:   03/04/2025    Preferred imaging location?:   [facility]   Meds ordered this encounter  Medications   mometasone -formoterol  (DULERA) 100-5 MCG/ACT AERO    Sig: Inhale 2 puffs into the lungs 2 (two) times daily.    Dispense:  13 g    Refill:  11    Return in about 1 year (around 03/04/2025).  I have spent a total time of 35-minutes on the day of the appointment including chart review, data review, collecting history, coordinating care and discussing medical diagnosis and plan with the patient/family. Past medical history, allergies, medications were reviewed. Pertinent imaging, labs and tests included in this note have been reviewed and interpreted independently by me.  Tenya Araque Slater Staff, MD [facility] 03/04/2024 1:14 PM

## 2024-03-21 ENCOUNTER — Telehealth: Payer: Self-pay | Admitting: Pulmonary Disease

## 2024-03-21 MED ORDER — BUDESONIDE-FORMOTEROL FUMARATE 80-4.5 MCG/ACT IN AERO: 2.0000 | INHALATION_SPRAY | Freq: Two times a day (BID) | RESPIRATORY_TRACT | 5 refills | Status: AC

## 2024-03-21 NOTE — Telephone Encounter (Signed)
 Please contact patient that insurance no longer covers Dulera. New inhaler ordered is Breyna 80-4.5 TWO puffs TWICE a day

## 2024-03-21 NOTE — Telephone Encounter (Signed)
 Pt.notified

## 2024-03-27 LAB — PULMONARY FUNCTION TEST
DL/VA % pred: 97 %
DL/VA: 4.24 ml/min/mmHg/L
DLCO cor % pred: 76 %
DLCO cor: 12.71 ml/min/mmHg
DLCO unc % pred: 76 %
DLCO unc: 12.71 ml/min/mmHg
FEF 25-75 Post: 1.8 L/s
FEF 25-75 Pre: 1.74 L/s
FEF2575-%Change-Post: 3 %
FEF2575-%Pred-Post: 99 %
FEF2575-%Pred-Pre: 96 %
FEV1-%Change-Post: 0 %
FEV1-%Pred-Post: 90 %
FEV1-%Pred-Pre: 90 %
FEV1-Post: 1.75 L
FEV1-Pre: 1.74 L
FEV1FVC-%Change-Post: 1 %
FEV1FVC-%Pred-Pre: 104 %
FEV6-%Change-Post: 0 %
FEV6-%Pred-Post: 89 %
FEV6-%Pred-Pre: 89 %
FEV6-Post: 2.17 L
FEV6-Pre: 2.18 L
FEV6FVC-%Change-Post: 0 %
FEV6FVC-%Pred-Post: 104 %
FEV6FVC-%Pred-Pre: 104 %
FVC-%Change-Post: 0 %
FVC-%Pred-Post: 85 %
FVC-%Pred-Pre: 85 %
FVC-Post: 2.17 L
Post FEV1/FVC ratio: 81 %
Post FEV6/FVC ratio: 100 %
Pre FEV1/FVC ratio: 80 %
Pre FEV6/FVC Ratio: 100 %
RV % pred: 82 %
RV: 1.53 L
TLC % pred: 86 %
TLC: 3.71 L

## 2024-04-07 DIAGNOSIS — R600 Localized edema: Secondary | ICD-10-CM | POA: Diagnosis not present

## 2024-04-07 DIAGNOSIS — I872 Venous insufficiency (chronic) (peripheral): Secondary | ICD-10-CM | POA: Diagnosis not present

## 2024-04-07 DIAGNOSIS — L299 Pruritus, unspecified: Secondary | ICD-10-CM | POA: Diagnosis not present

## 2024-04-07 DIAGNOSIS — R252 Cramp and spasm: Secondary | ICD-10-CM | POA: Diagnosis not present

## 2024-04-16 DIAGNOSIS — H5213 Myopia, bilateral: Secondary | ICD-10-CM | POA: Diagnosis not present

## 2024-05-09 DIAGNOSIS — R351 Nocturia: Secondary | ICD-10-CM | POA: Diagnosis not present

## 2024-05-09 DIAGNOSIS — N3281 Overactive bladder: Secondary | ICD-10-CM | POA: Diagnosis not present

## 2024-05-09 DIAGNOSIS — R350 Frequency of micturition: Secondary | ICD-10-CM | POA: Diagnosis not present

## 2024-05-22 ENCOUNTER — Other Ambulatory Visit: Payer: Self-pay | Admitting: Family Medicine

## 2024-05-22 DIAGNOSIS — Z1231 Encounter for screening mammogram for malignant neoplasm of breast: Secondary | ICD-10-CM

## 2024-06-16 ENCOUNTER — Ambulatory Visit
Admission: RE | Admit: 2024-06-16 | Discharge: 2024-06-16 | Disposition: A | Source: Ambulatory Visit | Attending: Family Medicine

## 2024-06-16 DIAGNOSIS — Z1231 Encounter for screening mammogram for malignant neoplasm of breast: Secondary | ICD-10-CM

## 2024-08-18 ENCOUNTER — Ambulatory Visit: Admitting: Adult Health

## 2024-09-02 ENCOUNTER — Ambulatory Visit (HOSPITAL_BASED_OUTPATIENT_CLINIC_OR_DEPARTMENT_OTHER): Admitting: Pulmonary Disease

## 2025-03-04 ENCOUNTER — Other Ambulatory Visit
